# Patient Record
Sex: Male | Born: 1955 | Race: White | Hispanic: No | Marital: Single | Smoking: Current every day smoker
Health system: Southern US, Community
[De-identification: ages and names within clinical notes are randomized; demographics above are authoritative.]

---

## 2016-01-05 ENCOUNTER — Encounter (HOSPITAL_BASED_OUTPATIENT_CLINIC_OR_DEPARTMENT_OTHER): Payer: Self-pay | Admitting: Emergency Medicine

## 2016-01-05 ENCOUNTER — Emergency Department (HOSPITAL_BASED_OUTPATIENT_CLINIC_OR_DEPARTMENT_OTHER)
Admission: EM | Admit: 2016-01-05 | Discharge: 2016-01-05 | Disposition: A | Discharge status: Home / Self Care | Payer: Self-pay | Attending: Emergency Medicine | Admitting: Emergency Medicine

## 2016-01-05 DIAGNOSIS — G5701 Lesion of sciatic nerve, right lower limb: Secondary | ICD-10-CM | POA: Insufficient documentation

## 2016-01-05 DIAGNOSIS — F17210 Nicotine dependence, cigarettes, uncomplicated: Secondary | ICD-10-CM | POA: Insufficient documentation

## 2016-01-05 MED ORDER — KETOROLAC TROMETHAMINE 60 MG/2ML IM SOLN
30.0000 mg | Freq: Once | INTRAMUSCULAR | Status: AC
  Administered 2016-01-05: 30 mg via INTRAMUSCULAR
  Filled 2016-01-05: qty 2

## 2016-01-05 MED ORDER — ACETAMINOPHEN 500 MG PO TABS
1000.0000 mg | ORAL_TABLET | Freq: Once | ORAL | Status: AC
  Administered 2016-01-05: 1000 mg via ORAL
  Filled 2016-01-05: qty 2

## 2016-01-05 MED ORDER — DIAZEPAM 5 MG PO TABS
5.0000 mg | ORAL_TABLET | Freq: Once | ORAL | Status: AC
  Administered 2016-01-05: 5 mg via ORAL
  Filled 2016-01-05: qty 1

## 2016-01-05 MED ORDER — OXYCODONE HCL 5 MG PO TABS
5.0000 mg | ORAL_TABLET | Freq: Once | ORAL | Status: AC
  Administered 2016-01-05: 5 mg via ORAL
  Filled 2016-01-05: qty 1

## 2016-01-05 NOTE — Discharge Instructions (Signed)
Try the stretches, repeat 3 times for about 30s.  Do this 2 x a day.

## 2016-01-05 NOTE — ED Notes (Signed)
MD at bedside. 

## 2016-01-05 NOTE — ED Provider Notes (Signed)
[name] Note   CSN: [id number] Arrival date & time: 01/05/16  1035     History   Chief Complaint Chief Complaint  Patient presents with  . Back Pain    HPI Adam Hinton is a 60 y.o. male.  60 yo M with a chief complaint of right-sided low back pain. Going on for the past 3 days. Patient has just been lying in bed unable to get up and move around. Denies loss of bowel or bladder denies loss of perirectal sensation. Denies any weakness to the right lower extremity. Denies fevers denies abdominal pain. Patient complaining of pain mostly to the right SI joint though has some pain also at the greater trochanter. Pain is worse with certain positions and movement palpation. He feels that when he lays on his left side the stretching of the gluteus hurts his back. Denies recent trauma. Had a trauma back in May that had a negative CT abd/ pelvis.   The history is provided by the patient and a relative.  Back Pain   This is a recurrent problem. The current episode started more than 2 days ago. The problem occurs constantly. The problem has been gradually worsening. The pain is associated with twisting (playing golf). The pain is present in the lumbar spine. The quality of the pain is described as stabbing and shooting. The pain radiates to the right thigh. The pain is at a severity of 3/10. The pain is moderate. The symptoms are aggravated by bending, certain positions and twisting. Pertinent negatives include no chest pain, no fever, no headaches and no abdominal pain. He has tried analgesics and muscle relaxants for the symptoms. The treatment provided moderate relief.    History reviewed. No pertinent past medical history.  There are no active problems to display for this patient.   History reviewed. No pertinent surgical history.     Home Medications    Prior to Admission medications   Not on File    Family History History reviewed. No pertinent family  history.  Social History Social History  Substance Use Topics  . Smoking status: Current Every Day Smoker    Packs/day: 1.00    Types: Cigarettes  . Smokeless tobacco: Never Used  . Alcohol use Yes     Allergies   Review of patient's allergies indicates no known allergies.   Review of Systems Review of Systems  Constitutional: Negative for chills and fever.  HENT: Negative for congestion and facial swelling.   Eyes: Negative for discharge and visual disturbance.  Respiratory: Negative for shortness of breath.   Cardiovascular: Negative for chest pain and palpitations.  Gastrointestinal: Negative for abdominal pain, diarrhea and vomiting.  Musculoskeletal: Positive for arthralgias, back pain and myalgias.  Skin: Negative for color change and rash.  Neurological: Negative for tremors, syncope and headaches.  Psychiatric/Behavioral: Negative for confusion and dysphoric mood.     Physical Exam Updated Vital Signs BP 152/89 (BP Location: Left Arm)   Pulse 72   Temp 98.2 F (36.8 C) (Oral)   Resp 18   Ht 5\' 8"  (1.727 m)   Wt 138 lb (62.6 kg)   SpO2 100%   BMI 20.98 kg/m   Physical Exam  Constitutional: He is oriented to person, place, and time. He appears well-developed and well-nourished.  HENT:  Head: Normocephalic and atraumatic.  Eyes: Conjunctivae and EOM are normal. Pupils are equal, round, and reactive to light.  Neck: Normal range of motion. No JVD present.  Cardiovascular:  Normal rate and regular rhythm.   Pulmonary/Chest: Effort normal. No stridor. No respiratory distress.  Abdominal: He exhibits no distension. There is no tenderness. There is no guarding.  Musculoskeletal: Normal range of motion. He exhibits no edema.       Back:  Neurological: He is alert and oriented to person, place, and time.  Skin: Skin is warm and dry.  Psychiatric: He has a normal mood and affect. His behavior is normal.     ED Treatments / Results  Labs (all labs ordered  are listed, but only abnormal results are displayed) Labs Reviewed - No data to display  EKG  EKG Interpretation None       Radiology No results found.  Procedures Procedures (including critical care time)  Medications Ordered in ED Medications  diazepam (VALIUM) tablet 5 mg (5 mg Oral Given 01/05/16 1114)  oxyCODONE (Oxy IR/ROXICODONE) immediate release tablet 5 mg (5 mg Oral Given 01/05/16 1113)  acetaminophen (TYLENOL) tablet 1,000 mg (1,000 mg Oral Given 01/05/16 1113)  ketorolac (TORADOL) injection 30 mg (30 mg Intramuscular Given 01/05/16 1113)     Initial Impression / Assessment and Plan / ED Course  I have reviewed the triage vital signs and the nursing notes.  Pertinent labs & imaging results that were available during my care of the patient were reviewed by me and considered in my medical decision making (see chart for details).  Clinical Course    60 yo M With a chief complaint of right-sided low back pain. I suspect this may be piriformis syndrome that he has no tenderness about the piriformis muscle. Will treat with NSAIDs.  11:17 AM:  I have discussed the diagnosis/risks/treatment options with the patient and family and believe the pt to be eligible for discharge home to follow-up with PCP. We also discussed returning to the ED immediately if new or worsening sx occur. We discussed the sx which are most concerning (e.g., sudden worsening pain, fever, inability to tolerate by mouth) that necessitate immediate return. Medications administered to the patient during their visit and any new prescriptions provided to the patient are listed below.  Medications given during this visit Medications  diazepam (VALIUM) tablet 5 mg (5 mg Oral Given 01/05/16 1114)  oxyCODONE (Oxy IR/ROXICODONE) immediate release tablet 5 mg (5 mg Oral Given 01/05/16 1113)  acetaminophen (TYLENOL) tablet 1,000 mg (1,000 mg Oral Given 01/05/16 1113)  ketorolac (TORADOL) injection 30 mg (30 mg Intramuscular  Given 01/05/16 1113)     The patient appears reasonably screen and/or stabilized for discharge and I doubt any other medical condition or other [facility] requiring further screening, evaluation, or treatment in the ED at this time prior to discharge.    Final Clinical Impressions(s) / ED Diagnoses   Final diagnoses:  Piriformis syndrome of right side    New Prescriptions New Prescriptions   No medications on file     Deno Etienne, DO 01/05/16 1117

## 2016-01-05 NOTE — ED Triage Notes (Signed)
Patient reports right lower back and hip pain since yesterday morning.  Reports he took 10mg  of a muscle relaxer yesterday (thinks this was flexeril) and this helped but states his walk is not "right".  No incontinence episodes, no dysuria.

## 2018-02-21 ENCOUNTER — Other Ambulatory Visit: Payer: Self-pay

## 2018-02-21 ENCOUNTER — Encounter (HOSPITAL_COMMUNITY): Payer: Self-pay | Admitting: Emergency Medicine

## 2018-02-21 ENCOUNTER — Inpatient Hospital Stay (HOSPITAL_COMMUNITY)
Admission: EM | Admit: 2018-02-21 | Discharge: 2018-02-25 | DRG: 728 | Disposition: A | Discharge status: Home / Self Care | Payer: BLUE CROSS/BLUE SHIELD | Attending: Family Medicine | Admitting: Family Medicine

## 2018-02-21 ENCOUNTER — Emergency Department (HOSPITAL_COMMUNITY): Payer: BLUE CROSS/BLUE SHIELD

## 2018-02-21 DIAGNOSIS — K635 Polyp of colon: Secondary | ICD-10-CM | POA: Diagnosis present

## 2018-02-21 DIAGNOSIS — K5730 Diverticulosis of large intestine without perforation or abscess without bleeding: Secondary | ICD-10-CM | POA: Diagnosis present

## 2018-02-21 DIAGNOSIS — K5900 Constipation, unspecified: Secondary | ICD-10-CM | POA: Diagnosis not present

## 2018-02-21 DIAGNOSIS — E876 Hypokalemia: Secondary | ICD-10-CM | POA: Diagnosis not present

## 2018-02-21 DIAGNOSIS — K621 Rectal polyp: Secondary | ICD-10-CM | POA: Diagnosis present

## 2018-02-21 DIAGNOSIS — R102 Pelvic and perineal pain: Secondary | ICD-10-CM

## 2018-02-21 DIAGNOSIS — N419 Inflammatory disease of prostate, unspecified: Secondary | ICD-10-CM | POA: Diagnosis not present

## 2018-02-21 DIAGNOSIS — N390 Urinary tract infection, site not specified: Secondary | ICD-10-CM | POA: Diagnosis present

## 2018-02-21 DIAGNOSIS — R3911 Hesitancy of micturition: Secondary | ICD-10-CM | POA: Diagnosis present

## 2018-02-21 DIAGNOSIS — R933 Abnormal findings on diagnostic imaging of other parts of digestive tract: Secondary | ICD-10-CM

## 2018-02-21 DIAGNOSIS — D121 Benign neoplasm of appendix: Secondary | ICD-10-CM

## 2018-02-21 DIAGNOSIS — N138 Other obstructive and reflux uropathy: Secondary | ICD-10-CM | POA: Diagnosis present

## 2018-02-21 DIAGNOSIS — Z720 Tobacco use: Secondary | ICD-10-CM | POA: Diagnosis present

## 2018-02-21 DIAGNOSIS — K625 Hemorrhage of anus and rectum: Secondary | ICD-10-CM | POA: Diagnosis present

## 2018-02-21 DIAGNOSIS — N139 Obstructive and reflux uropathy, unspecified: Secondary | ICD-10-CM

## 2018-02-21 DIAGNOSIS — K644 Residual hemorrhoidal skin tags: Secondary | ICD-10-CM | POA: Diagnosis present

## 2018-02-21 DIAGNOSIS — R030 Elevated blood-pressure reading, without diagnosis of hypertension: Secondary | ICD-10-CM | POA: Diagnosis present

## 2018-02-21 DIAGNOSIS — N320 Bladder-neck obstruction: Secondary | ICD-10-CM | POA: Diagnosis not present

## 2018-02-21 DIAGNOSIS — F17210 Nicotine dependence, cigarettes, uncomplicated: Secondary | ICD-10-CM | POA: Diagnosis present

## 2018-02-21 DIAGNOSIS — N401 Enlarged prostate with lower urinary tract symptoms: Secondary | ICD-10-CM | POA: Diagnosis present

## 2018-02-21 DIAGNOSIS — K388 Other specified diseases of appendix: Secondary | ICD-10-CM

## 2018-02-21 DIAGNOSIS — K642 Third degree hemorrhoids: Secondary | ICD-10-CM | POA: Diagnosis present

## 2018-02-21 DIAGNOSIS — R32 Unspecified urinary incontinence: Secondary | ICD-10-CM | POA: Diagnosis present

## 2018-02-21 DIAGNOSIS — R1024 Suprapubic pain: Secondary | ICD-10-CM

## 2018-02-21 LAB — CBC
HCT: 47.6 % (ref 39.0–52.0)
Hemoglobin: 15.8 g/dL (ref 13.0–17.0)
MCH: 30.6 pg (ref 26.0–34.0)
MCHC: 33.2 g/dL (ref 30.0–36.0)
MCV: 92.1 fL (ref 80.0–100.0)
NRBC: 0 % (ref 0.0–0.2)
PLATELETS: 254 10*3/uL (ref 150–400)
RBC: 5.17 MIL/uL (ref 4.22–5.81)
RDW: 13.3 % (ref 11.5–15.5)
WBC: 19.8 10*3/uL — ABNORMAL HIGH (ref 4.0–10.5)

## 2018-02-21 LAB — COMPREHENSIVE METABOLIC PANEL
ALBUMIN: 4.2 g/dL (ref 3.5–5.0)
ALK PHOS: 63 U/L (ref 38–126)
ALT: 16 U/L (ref 0–44)
ANION GAP: 9 (ref 5–15)
AST: 43 U/L — ABNORMAL HIGH (ref 15–41)
BUN: 12 mg/dL (ref 8–23)
CALCIUM: 9.5 mg/dL (ref 8.9–10.3)
CO2: 28 mmol/L (ref 22–32)
Chloride: 100 mmol/L (ref 98–111)
Creatinine, Ser: 1.15 mg/dL (ref 0.61–1.24)
GFR calc Af Amer: >60 mL/min (ref >60)
GFR calc non Af Amer: >60 mL/min (ref >60)
GLUCOSE: 120 mg/dL — AB (ref 70–99)
POTASSIUM: 3.1 mmol/L — AB (ref 3.5–5.1)
SODIUM: 137 mmol/L (ref 135–145)
TOTAL PROTEIN: 7.5 g/dL (ref 6.5–8.1)
Total Bilirubin: 1.2 mg/dL (ref 0.3–1.2)

## 2018-02-21 LAB — URINALYSIS, ROUTINE W REFLEX MICROSCOPIC
Bacteria, UA: NONE SEEN
Bilirubin Urine: NEGATIVE
GLUCOSE, UA: 50 mg/dL — AB
Ketones, ur: NEGATIVE mg/dL
LEUKOCYTES UA: NEGATIVE
Nitrite: NEGATIVE
PROTEIN: NEGATIVE mg/dL
SPECIFIC GRAVITY, URINE: 1.012 (ref 1.005–1.030)
pH: 6 (ref 5.0–8.0)

## 2018-02-21 LAB — LIPASE, BLOOD: LIPASE: 28 U/L (ref 11–51)

## 2018-02-21 MED ORDER — SODIUM CHLORIDE 0.9 % IV BOLUS
1000.0000 mL | Freq: Once | INTRAVENOUS | Status: AC
  Administered 2018-02-21: 1000 mL via INTRAVENOUS

## 2018-02-21 MED ORDER — ACETAMINOPHEN 650 MG RE SUPP: 650.0000 mg | Freq: Four times a day (QID) | RECTAL | Status: DC | PRN

## 2018-02-21 MED ORDER — LIDOCAINE HCL URETHRAL/MUCOSAL 2 % EX GEL
1.0000 "application " | Freq: Once | CUTANEOUS | Status: DC
  Filled 2018-02-21: qty 5

## 2018-02-21 MED ORDER — ACETAMINOPHEN 325 MG PO TABS: 650.0000 mg | ORAL_TABLET | Freq: Four times a day (QID) | ORAL | Status: DC | PRN

## 2018-02-21 MED ORDER — LORAZEPAM 0.5 MG PO TABS: 0.5000 mg | ORAL_TABLET | Freq: Once | ORAL | Status: DC

## 2018-02-21 MED ORDER — CIPROFLOXACIN IN D5W 400 MG/200ML IV SOLN
400.0000 mg | Freq: Once | INTRAVENOUS | Status: AC
  Administered 2018-02-21: 400 mg via INTRAVENOUS
  Filled 2018-02-21: qty 200

## 2018-02-21 MED ORDER — ONDANSETRON HCL 4 MG/2ML IJ SOLN: 4.0000 mg | Freq: Four times a day (QID) | INTRAMUSCULAR | Status: DC | PRN

## 2018-02-21 MED ORDER — LORAZEPAM 2 MG/ML IJ SOLN
1.0000 mg | Freq: Once | INTRAMUSCULAR | Status: AC
  Administered 2018-02-22: 1 mg via INTRAVENOUS
  Filled 2018-02-21: qty 1

## 2018-02-21 MED ORDER — BISACODYL 10 MG RE SUPP: 10.0000 mg | Freq: Every day | RECTAL | Status: DC | PRN

## 2018-02-21 MED ORDER — ONDANSETRON HCL 4 MG PO TABS: 4.0000 mg | ORAL_TABLET | Freq: Four times a day (QID) | ORAL | Status: DC | PRN

## 2018-02-21 MED ORDER — POLYETHYLENE GLYCOL 3350 17 G PO PACK
17.0000 g | PACK | Freq: Every day | ORAL | Status: DC
  Administered 2018-02-21 – 2018-02-25 (×4): 17 g via ORAL
  Filled 2018-02-21 (×4): qty 1

## 2018-02-21 MED ORDER — METRONIDAZOLE IN NACL 5-0.79 MG/ML-% IV SOLN
500.0000 mg | Freq: Three times a day (TID) | INTRAVENOUS | Status: DC
  Administered 2018-02-21 – 2018-02-25 (×10): 500 mg via INTRAVENOUS
  Filled 2018-02-21 (×16): qty 100

## 2018-02-21 MED ORDER — METRONIDAZOLE IN NACL 5-0.79 MG/ML-% IV SOLN
500.0000 mg | Freq: Once | INTRAVENOUS | Status: AC
  Administered 2018-02-21: 500 mg via INTRAVENOUS
  Filled 2018-02-21: qty 100

## 2018-02-21 MED ORDER — SENNOSIDES-DOCUSATE SODIUM 8.6-50 MG PO TABS
1.0000 | ORAL_TABLET | Freq: Every day | ORAL | Status: DC
  Administered 2018-02-22 – 2018-02-25 (×3): 1 via ORAL
  Filled 2018-02-21 (×3): qty 1

## 2018-02-21 MED ORDER — CIPROFLOXACIN IN D5W 400 MG/200ML IV SOLN
400.0000 mg | Freq: Two times a day (BID) | INTRAVENOUS | Status: DC
  Administered 2018-02-22 – 2018-02-25 (×7): 400 mg via INTRAVENOUS
  Filled 2018-02-21 (×9): qty 200

## 2018-02-21 MED ORDER — IOHEXOL 300 MG/ML  SOLN
100.0000 mL | Freq: Once | INTRAMUSCULAR | Status: AC | PRN
  Administered 2018-02-21: 100 mL via INTRAVENOUS

## 2018-02-21 MED ORDER — NICOTINE 14 MG/24HR TD PT24
14.0000 mg | MEDICATED_PATCH | Freq: Every day | TRANSDERMAL | Status: DC
  Administered 2018-02-21 – 2018-02-25 (×4): 14 mg via TRANSDERMAL
  Filled 2018-02-21 (×4): qty 1

## 2018-02-21 MED ORDER — LIDOCAINE HCL URETHRAL/MUCOSAL 2 % EX GEL
1.0000 "application " | CUTANEOUS | Status: AC
  Administered 2018-02-22: 1 via URETHRAL
  Filled 2018-02-21: qty 20

## 2018-02-21 MED ORDER — POTASSIUM CHLORIDE CRYS ER 20 MEQ PO TBCR
40.0000 meq | EXTENDED_RELEASE_TABLET | Freq: Once | ORAL | Status: AC
  Administered 2018-02-21: 40 meq via ORAL
  Filled 2018-02-21: qty 2

## 2018-02-21 NOTE — ED Notes (Signed)
Gave Pt urine cup in hazard bag and he states he will attempt to urinate but had just went

## 2018-02-21 NOTE — ED Provider Notes (Signed)
[facility] EMERGENCY DEPARTMENT Provider Note   CSN: [id number] Arrival date & time: 02/21/18  1015     History   Chief Complaint Chief Complaint  Patient presents with  . Abdominal Pain  . Constipation    HPI Adam Hinton is a 62 y.o. male.  HPI  Patient is a 62 year old male with no significant past medical history presenting for suprapubic pain, incontinence of urine, and constipation.  Patient reports that for the past 2 days, he has felt himself triple incontinence of urine on himself.  He reports that throughout this time he has had a constant suprapubic pain and cannot empty his bladder.  He also reports he has not had a bowel movement in 2 days, which is abnormal for him.  Patient reports he tried multiple suppositories, which produced some minor amount of watery stool.  Patient reports that his rectum has intermittently bled and is "inflamed".  Patient denies back pain, fever, history of IVDU, history of cancer, weakness or numbness in lower extremities, or saddle anesthesia or loss of bowel control.  Patient reports he has been sexually active with the same male partner for the past 15 years, last unprotected sexual intercourse approximately 1 month ago.  Denies scrotal swelling, testicular pain, penile discharge.  Patient denies concerns about STI.  Patient reports he takes no medications.    History reviewed. No pertinent past medical history.  There are no active problems to display for this patient.   History reviewed. No pertinent surgical history.      Home Medications    Prior to Admission medications   Not on File    Family History No family history on file.  Social History Social History   Tobacco Use  . Smoking status: Current Every Day Smoker    Packs/day: 1.00    Types: Cigarettes  . Smokeless tobacco: Never Used  Substance Use Topics  . Alcohol use: Yes    Comment: daily   . Drug use: No     Allergies   Patient  has no known allergies.   Review of Systems Review of Systems  Constitutional: Negative for chills and fever.  HENT: Negative for congestion and sore throat.   Eyes: Negative for visual disturbance.  Respiratory: Negative for cough, chest tightness and shortness of breath.   Cardiovascular: Negative for chest pain, palpitations and leg swelling.  Gastrointestinal: Positive for abdominal pain. Negative for diarrhea and nausea.  Genitourinary: Negative for discharge, dysuria, flank pain, scrotal swelling and testicular pain.  Musculoskeletal: Negative for back pain and myalgias.  Skin: Negative for rash.  Neurological: Negative for dizziness, syncope, light-headedness and headaches.     Physical Exam Updated Vital Signs BP (!) 182/91   Pulse 76   Temp 98.1 F (36.7 C)   Resp 20   Ht 5\' 9"  (1.753 m)   Wt 68 kg   SpO2 98%   BMI 22.15 kg/m   Physical Exam  Constitutional: He appears well-developed and well-nourished. No distress.  HENT:  Head: Normocephalic and atraumatic.  Mouth/Throat: Oropharynx is clear and moist.  Eyes: Pupils are equal, round, and reactive to light. Conjunctivae and EOM are normal.  Neck: Normal range of motion. Neck supple.  Cardiovascular: Normal rate, regular rhythm, S1 normal and S2 normal.  No murmur heard. Pulmonary/Chest: Effort normal and breath sounds normal. He has no wheezes. He has no rales.  Abdominal: Soft. He exhibits no distension. There is tenderness in the suprapubic area. There is no guarding.  Genitourinary:  Genitourinary Comments: GU examination performed with EMT chaperone present.  Patient has no tenderness to palpation of bilateral testes.  No masses.  No swelling.  No erythema. Rectal exam, patient has multiple prolapsed internal hemorrhoids and surrounding erythema and inflammation of the rectum.  There is no induration.  No fluctuance.  Digital rectal exam exhibits circumferential thickening of the rectum, and normal rectal  tone.  No masses.  No stool impaction.  Musculoskeletal: Normal range of motion. He exhibits no edema or deformity.  Lymphadenopathy:    He has no cervical adenopathy.  Neurological: He is alert.  Cranial nerves grossly intact. Patient moves extremities symmetrically and with good coordination.  Skin: Skin is warm and dry. No rash noted. No erythema.  Psychiatric: He has a normal mood and affect. His behavior is normal. Judgment and thought content normal.  Nursing note and vitals reviewed.    ED Treatments / Results  Labs (all labs ordered are listed, but only abnormal results are displayed) Labs Reviewed  COMPREHENSIVE METABOLIC PANEL - Abnormal; Notable for the following components:      Result Value   Potassium 3.1 (*)    Glucose, Bld 120 (*)    AST 43 (*)    All other components within normal limits  CBC - Abnormal; Notable for the following components:   WBC 19.8 (*)    All other components within normal limits  URINALYSIS, ROUTINE W REFLEX MICROSCOPIC - Abnormal; Notable for the following components:   Glucose, UA 50 (*)    Hgb urine dipstick MODERATE (*)    All other components within normal limits  URINE CULTURE  LIPASE, BLOOD  RPR  HIV ANTIBODY (ROUTINE TESTING W REFLEX)  GC/CHLAMYDIA PROBE AMP ([place]) NOT AT [facility]    EKG None  Radiology Ct Abdomen Pelvis W Contrast  Result Date: 02/21/2018 CLINICAL DATA:  Constipation and urinary retention for last 2 days. EXAM: CT ABDOMEN AND PELVIS WITH CONTRAST TECHNIQUE: Multidetector CT imaging of the abdomen and pelvis was performed using the standard protocol following bolus administration of intravenous contrast. CONTRAST:  119mL OMNIPAQUE IOHEXOL 300 MG/ML  SOLN COMPARISON:  None. FINDINGS: Lower chest: Mild ground-glass attenuation of the right middle lobe and lingula, likely infectious/inflammatory. Hepatobiliary: No focal liver abnormality is seen. No gallstones, gallbladder wall thickening, or biliary  dilatation. Pancreas: Unremarkable. No pancreatic ductal dilatation or surrounding inflammatory changes. Spleen: Normal in size without focal abnormality. Adrenals/Urinary Tract: Normal adrenal glands. Bilateral perinephric stranding, more prominent on the right. Bilateral benign-appearing renal cysts, the larger of of the superior pole of the left kidney measures 3 cm. No nephrolithiasis or hydronephrosis. Mild mucosal thickening and hyperenhancement of the urinary bladder, and possibly the ureters. Stomach/Bowel: Normal appearance of the stomach and small bowel. Scattered colonic diverticulosis without evidence of acute diverticulitis. Low-density circumscribed structure in the right pelvis measures 2.4 x 3.9 x 3.0 cm. It comes in contact with the cecum, inferior to the ileocecal valve, at the expected location of the appendix. Vascular/Lymphatic: Aortic atherosclerosis, mild. No enlarged abdominal or pelvic lymph nodes. Reproductive: Enlargement and heterogeneous enhancement of the prostate gland, which measures 5.9 cm Other: No abdominal wall hernia or abnormality. No abdominopelvic ascites. Musculoskeletal: No acute or significant osseous findings. IMPRESSION: Bilateral perinephric stranding, hyperenhancement and mucosal thickening of the urinary bladder and the bilateral ureters. Enlargement and heterogeneous enhancement of the prostate gland. These findings may reflect complicated urinary tract infection/prostatitis, or bladder outlet obstruction caused by the enlarged prostate gland. Please correlate  to PSA values and urinalysis results. Low-density circumscribed structure adjacent to the distal cecum may represent a cystically dilated appendix with thickened walls. This may be secondary to an acute appendicitis or an appendiceal mucocele. Surgical consultation is recommended. Electronically Signed   By: Fidela Salisbury M.D.   On: 02/21/2018 15:59    Procedures Procedures (including critical care  time)  Medications Ordered in ED Medications  lidocaine (XYLOCAINE) 2 % jelly 1 application (1 application Urethral Not Given 02/21/18 1509)  LORazepam (ATIVAN) tablet 0.5 mg (has no administration in time range)  potassium chloride SA (K-DUR,KLOR-CON) CR tablet 40 mEq (has no administration in time range)  iohexol (OMNIPAQUE) 300 MG/ML solution 100 mL (100 mLs Intravenous Contrast Given 02/21/18 1508)     Initial Impression / Assessment and Plan / ED Course  I have reviewed the triage vital signs and the nursing notes.  Pertinent labs & imaging results that were available during my care of the patient were reviewed by me and considered in my medical decision making (see chart for details).  Clinical Course as of Feb 22 1735  Sat Feb 21, 2018  1534 GFR, Est Non African American: >60 [AM]  1616 Suggestive of acute infection.  WBC(!): 19.8 [AM]  71 Spoke with Dr. Barry Dienes who will come to evaluate the patient.  I appreciate her involvement in the care of this patient.   [AM]  1640 Spoke with Dr. grounds of [facility] hospitalist who will admit the patient.  I appreciate his involvement in the care of this patient.   [AM]    Clinical Course User Index [AM] Albesa Seen, PA-C    Patient nontoxic-appearing, afebrile, and with benign abdomen.  Differential diagnosis includes bladder outlet obstruction, origin in prostate either infectious versus inflammatory versus neoplastic versus other obstructive process in the pelvis.  Patient does not have any back pain or neurologic symptoms in his lower extremities suggestive of cauda equina as the cause of his overflow incontinence.  Patient has profound leukocytosis.  Possibly suggestive of infection.  Patient has moderate hemoglobinuria, but otherwise no obvious signs of infection in the urine.  Patient is hypokalemic at 3.1.  CT scan of the abdomen and pelvis is demonstrating perinephric stranding, low-attenuation mass in the pelvis with  inflammatory changes, coming in contact with ileocecal valve area where appendix is suspected.  Radiologist recommends clinical correlation for appendicitis.  Patient covered with both ciprofloxacin for prostatitis and Flagyl.  Patient to be admitted.   This is a shared visit with Dr. Lennice Sites. Patient was independently evaluated by this attending physician. Attending physician consulted in evaluation and admission management.  Final Clinical Impressions(s) / ED Diagnoses   Final diagnoses:  Suprapubic pain  Urinary obstruction  Elevated blood pressure reading without diagnosis of hypertension    ED Discharge Orders    None       Tamala Julian 02/21/18 1737    Lennice Sites, DO 02/21/18 1925

## 2018-02-21 NOTE — H&P (Signed)
History and Physical   Adam Hinton ZOX:[id number] DOB: 1956-04-06 DOA: 02/21/2018  Referring MD/NP/PA: Langston Masker, PA PCP: None Outpatient Specialists: None  Patient coming from: Home by way of [facility]  Chief Complaint: Urinary retention, pelvic pressure/pain, constipation  HPI: Adam Hinton is a 62 y.o. male with no medical follow up or known PMH who presented to the ED on the advise of urgent care MD due to urinary retention among other symptoms. He reports noticing gradual onset of pressure-type discomfort in the suprapubic area and superior to the base of the penis 2 nights ago which has remained constant, waxing/waning and is associated with severe urinary hesitancy and small volume urinary incontinence and constipation. Constipation was not improved with suppository and caused him to stop eating, though he kept drinking fluids.    ED Course: He appeared in no distress, hypertensive, metabolic panel with K 3.1, creatinine 1.15, and WBC 19k. He was unable to urinate so I/O catheterization was performed for urinalysis, yielding 1300cc urine which had no WBCs or bacteria, 6-10 RBCs. CT abdomen/pelvis performed showed a circumscribed low density structure in the expected location of the appendix that was 2.4x3.9x3.0cm, diverticulosis without diverticulitis, and R > L perinephric stranding without hydronephrosis, ureteral and bladder wall enhancement and enlarged (5.9cm) heterogeneously enhancing prostate. Mild GGOs in RML and lingula incidentally noted as well. Concern was for appendicitis vs. mucocele. Surgery consulted, hospitalists asked to admit. Cipro/flagyl started. GI and urology consulted for additional recommendations and indwelling foley catheterization ordered with bladder scan showing 999cc.   - ROS: No fevers, chills, abdominal pain. +nausea and vomiting. Some red blood per rectum but not significant. No other bleeding/bruising. Denies chest pain, dyspnea, cough, and per HPI.  All others reviewed and are negative.   - SH: Smokes < 1/2ppd cigarettes and drinks irregularly. No illicit drugs. Has a single male partner but does not live with her.   - FH: No history of prostate cancer, colon cancer  - PMSH: None, has been told he may have HTN.   - Meds: None  - Allergies: None known  Physical Exam: Vitals:   02/21/18 1715 02/21/18 1730 02/21/18 1745 02/21/18 1800  BP:  (!) 158/90  (!) 148/77  Pulse: 74 70 69 99  Resp:      Temp:      SpO2: 97% 95% 96% 94%  Weight:      Height:       Constitutional: 62 y.o. male in no distress, restless demeanor Eyes: Lids and conjunctivae normal, PERRL ENMT: Mucous membranes are moist. Posterior pharynx clear of any exudate or lesions. Fair dentition.  Neck: normal, supple, no masses, no thyromegaly Respiratory: Non-labored breathing room air without accessory muscle use. Clear breath sounds to auscultation bilaterally Cardiovascular: Regular rate and rhythm, no murmurs, rubs, or gallops. No carotid bruits. No JVD. No LE edema. 2+ pedal pulses. Abdomen: Hypoactive bowel sounds. Minimal tenderness to deep palpation bilaterally. No RLQ point tenderness. +Suprapubic tenderness. Nondistended, no hernia. GU: Normal circumcised penis. No bleeding. No indwelling catheter Musculoskeletal: No clubbing / cyanosis. No joint deformity upper and lower extremities. Good ROM, no contractures. Normal muscle tone.  Skin: Warm, dry. No rashes, wounds, or ulcers. No significant lesions noted.  Neurologic: CN II-XII grossly intact. Gait normal. Speech normal. No focal deficits in motor strength or sensation in all extremities.  Psychiatric: Alert and oriented x3. Borderline flight of ideas but able to maintain linear thought process when redirected. No profuse speech, pressured speech, unredirectable tangentiality. Abnormal affect  with intermittent moaning when discussing plan of care possibly indicating mild histrionic tendencies.   Labs on  Admission: I have personally reviewed following labs and imaging studies  CBC: Recent Labs  Lab 02/21/18 1048  WBC 19.8*  HGB 15.8  HCT 47.6  MCV 92.1  PLT 016   Basic Metabolic Panel: Recent Labs  Lab 02/21/18 1048  NA 137  K 3.1*  CL 100  CO2 28  GLUCOSE 120*  BUN 12  CREATININE 1.15  CALCIUM 9.5   Liver Function Tests: Recent Labs  Lab 02/21/18 1048  AST 43*  ALT 16  ALKPHOS 63  BILITOT 1.2  PROT 7.5  ALBUMIN 4.2   Recent Labs  Lab 02/21/18 1048  LIPASE 28   Urine analysis:    Component Value Date/Time   COLORURINE YELLOW 02/21/2018 [place] 02/21/2018 1145   LABSPEC 1.012 02/21/2018 1145   PHURINE 6.0 02/21/2018 1145   GLUCOSEU 50 (A) 02/21/2018 1145   HGBUR MODERATE (A) 02/21/2018 1145   BILIRUBINUR NEGATIVE 02/21/2018 1145   KETONESUR NEGATIVE 02/21/2018 1145   PROTEINUR NEGATIVE 02/21/2018 1145   NITRITE NEGATIVE 02/21/2018 1145   LEUKOCYTESUR NEGATIVE 02/21/2018 1145   Radiological Exams on Admission: Ct Abdomen Pelvis W Contrast  Result Date: 02/21/2018 CLINICAL DATA:  Constipation and urinary retention for last 2 days. EXAM: CT ABDOMEN AND PELVIS WITH CONTRAST TECHNIQUE: Multidetector CT imaging of the abdomen and pelvis was performed using the standard protocol following bolus administration of intravenous contrast. CONTRAST:  182mL OMNIPAQUE IOHEXOL 300 MG/ML  SOLN COMPARISON:  None. FINDINGS: Lower chest: Mild ground-glass attenuation of the right middle lobe and lingula, likely infectious/inflammatory. Hepatobiliary: No focal liver abnormality is seen. No gallstones, gallbladder wall thickening, or biliary dilatation. Pancreas: Unremarkable. No pancreatic ductal dilatation or surrounding inflammatory changes. Spleen: Normal in size without focal abnormality. Adrenals/Urinary Tract: Normal adrenal glands. Bilateral perinephric stranding, more prominent on the right. Bilateral benign-appearing renal cysts, the larger of of the  superior pole of the left kidney measures 3 cm. No nephrolithiasis or hydronephrosis. Mild mucosal thickening and hyperenhancement of the urinary bladder, and possibly the ureters. Stomach/Bowel: Normal appearance of the stomach and small bowel. Scattered colonic diverticulosis without evidence of acute diverticulitis. Low-density circumscribed structure in the right pelvis measures 2.4 x 3.9 x 3.0 cm. It comes in contact with the cecum, inferior to the ileocecal valve, at the expected location of the appendix. Vascular/Lymphatic: Aortic atherosclerosis, mild. No enlarged abdominal or pelvic lymph nodes. Reproductive: Enlargement and heterogeneous enhancement of the prostate gland, which measures 5.9 cm Other: No abdominal wall hernia or abnormality. No abdominopelvic ascites. Musculoskeletal: No acute or significant osseous findings. IMPRESSION: Bilateral perinephric stranding, hyperenhancement and mucosal thickening of the urinary bladder and the bilateral ureters. Enlargement and heterogeneous enhancement of the prostate gland. These findings may reflect complicated urinary tract infection/prostatitis, or bladder outlet obstruction caused by the enlarged prostate gland. Please correlate to PSA values and urinalysis results. Low-density circumscribed structure adjacent to the distal cecum may represent a cystically dilated appendix with thickened walls. This may be secondary to an acute appendicitis or an appendiceal mucocele. Surgical consultation is recommended. Electronically Signed   By: Fidela Salisbury M.D.   On: 02/21/2018 15:59   Assessment/Plan Active Problems:   Prostatitis   Bladder outlet obstruction   Mucocele of appendix   Constipation   Tobacco use   Hypokalemia   Bladder outlet obstruction, prostatitis, UTI: Due to prostate enlargement. Acuity of onset, leukocytosis argues for prostatic  infection, though atypical symptoms, afebrile, and clean UA. Perinephric stranding and enhancement  of ureters/bladder thought to be either reactive to BOO or right pelvic inflammation based on bland UA. No nephrolithiasis on imaging, so likely RBCs on UA related to catheterization. - Patient needs foley catheterization. This was discussed at length, including the probability that it will be required for days-weeks pending urology input.  - Urology consulted, appreciate recommendations. - Agree with cipro/flagyl for now. - Trend WBC  Right pelvic inflammation: Mucocele felt to be more likely than appendicitis. Diverticulosis noted without evidence of diverticulitis. - Surgery consulted. Recommendations pending. Doubt surgery tonight, so will allow liquids, NPO p MN.  - GI consulted. Pt has never had colonoscopy.   Constipation: Probably due to poor po, vomiting, and colonic inflammation. No stool ball on CT.  - Senna, miralax, prn dulcolax  Tobacco use:  - cessation counseling provided - Nicotine patch  Hypokalemia: Replaced, will recheck.   Mild AST elevation: Unlikely to be clinically significant in isolation, possibly reactive to local inflammation. Normal-appearing liver on CT.  - No exhaustive work up planned.   DVT prophylaxis: SCDs due to report of red blood per rectum and possible procedure  Code Status: Full  Family Communication: Significant other at bedside Disposition Plan: Anticipate discharge home following recommended work up by consultants.  Consults called: General surgery, [facility], Urology  Admission status: Observation.    Patrecia Pour, MD [facility] [email or web address] Password [facility] 02/21/2018, 6:32 PM

## 2018-02-21 NOTE — ED Notes (Signed)
Pt states that he was able to have "a little bit of a watery stool, but not much, not a real one"

## 2018-02-21 NOTE — ED Notes (Signed)
Pt not in triage chairs of lobby when name called; found that patient in lobby bathroom.

## 2018-02-21 NOTE — ED Triage Notes (Signed)
Pt presents from [facility] to r/o mass or tumor causing severe constipation, urinary obstruction x2 days. States "for the first time since Friday, I was able to get a little bit of relief by runny stool and a little bit of pee". He has taken a stool softener yesterday.  Denies fever but had emesis yesterday. Pt alert and oriented NAD at triage.

## 2018-02-21 NOTE — Progress Notes (Signed)
Patient arrived to the unit A&Ox4 and ambulatory.  Patient and family oriented to room and equipment.  Will continue to monitor.
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
## 2018-02-22 ENCOUNTER — Other Ambulatory Visit: Payer: Self-pay

## 2018-02-22 DIAGNOSIS — N410 Acute prostatitis: Secondary | ICD-10-CM

## 2018-02-22 DIAGNOSIS — D121 Benign neoplasm of appendix: Secondary | ICD-10-CM | POA: Diagnosis present

## 2018-02-22 DIAGNOSIS — K625 Hemorrhage of anus and rectum: Secondary | ICD-10-CM | POA: Diagnosis present

## 2018-02-22 DIAGNOSIS — R933 Abnormal findings on diagnostic imaging of other parts of digestive tract: Secondary | ICD-10-CM

## 2018-02-22 DIAGNOSIS — D124 Benign neoplasm of descending colon: Secondary | ICD-10-CM | POA: Diagnosis not present

## 2018-02-22 DIAGNOSIS — N401 Enlarged prostate with lower urinary tract symptoms: Secondary | ICD-10-CM | POA: Diagnosis present

## 2018-02-22 DIAGNOSIS — N138 Other obstructive and reflux uropathy: Secondary | ICD-10-CM | POA: Diagnosis present

## 2018-02-22 DIAGNOSIS — R102 Pelvic and perineal pain: Secondary | ICD-10-CM

## 2018-02-22 DIAGNOSIS — R32 Unspecified urinary incontinence: Secondary | ICD-10-CM | POA: Diagnosis present

## 2018-02-22 DIAGNOSIS — K388 Other specified diseases of appendix: Secondary | ICD-10-CM | POA: Diagnosis not present

## 2018-02-22 DIAGNOSIS — R3911 Hesitancy of micturition: Secondary | ICD-10-CM | POA: Diagnosis present

## 2018-02-22 DIAGNOSIS — N390 Urinary tract infection, site not specified: Secondary | ICD-10-CM | POA: Diagnosis present

## 2018-02-22 DIAGNOSIS — N419 Inflammatory disease of prostate, unspecified: Secondary | ICD-10-CM | POA: Diagnosis present

## 2018-02-22 DIAGNOSIS — K621 Rectal polyp: Secondary | ICD-10-CM | POA: Diagnosis present

## 2018-02-22 DIAGNOSIS — E876 Hypokalemia: Secondary | ICD-10-CM | POA: Diagnosis present

## 2018-02-22 DIAGNOSIS — K5900 Constipation, unspecified: Secondary | ICD-10-CM | POA: Diagnosis present

## 2018-02-22 DIAGNOSIS — N320 Bladder-neck obstruction: Secondary | ICD-10-CM | POA: Diagnosis present

## 2018-02-22 DIAGNOSIS — F17210 Nicotine dependence, cigarettes, uncomplicated: Secondary | ICD-10-CM | POA: Diagnosis present

## 2018-02-22 DIAGNOSIS — K642 Third degree hemorrhoids: Secondary | ICD-10-CM | POA: Diagnosis present

## 2018-02-22 DIAGNOSIS — K644 Residual hemorrhoidal skin tags: Secondary | ICD-10-CM | POA: Diagnosis present

## 2018-02-22 DIAGNOSIS — N139 Obstructive and reflux uropathy, unspecified: Secondary | ICD-10-CM

## 2018-02-22 DIAGNOSIS — K635 Polyp of colon: Secondary | ICD-10-CM | POA: Diagnosis present

## 2018-02-22 DIAGNOSIS — D120 Benign neoplasm of cecum: Secondary | ICD-10-CM | POA: Diagnosis not present

## 2018-02-22 DIAGNOSIS — D128 Benign neoplasm of rectum: Secondary | ICD-10-CM | POA: Diagnosis not present

## 2018-02-22 DIAGNOSIS — D123 Benign neoplasm of transverse colon: Secondary | ICD-10-CM | POA: Diagnosis not present

## 2018-02-22 DIAGNOSIS — R030 Elevated blood-pressure reading, without diagnosis of hypertension: Secondary | ICD-10-CM | POA: Diagnosis present

## 2018-02-22 DIAGNOSIS — K5730 Diverticulosis of large intestine without perforation or abscess without bleeding: Secondary | ICD-10-CM | POA: Diagnosis present

## 2018-02-22 LAB — BASIC METABOLIC PANEL
Anion gap: 7 (ref 5–15)
BUN: 9 mg/dL (ref 8–23)
CO2: 26 mmol/L (ref 22–32)
CREATININE: 0.94 mg/dL (ref 0.61–1.24)
Calcium: 9.1 mg/dL (ref 8.9–10.3)
Chloride: 108 mmol/L (ref 98–111)
GFR calc non Af Amer: >60 mL/min (ref >60)
Glucose, Bld: 112 mg/dL — ABNORMAL HIGH (ref 70–99)
Potassium: 3.2 mmol/L — ABNORMAL LOW (ref 3.5–5.1)
SODIUM: 141 mmol/L (ref 135–145)

## 2018-02-22 LAB — URINE CULTURE: Culture: NO GROWTH

## 2018-02-22 LAB — CBC
HEMATOCRIT: 41.9 % (ref 39.0–52.0)
Hemoglobin: 14.1 g/dL (ref 13.0–17.0)
MCH: 30.5 pg (ref 26.0–34.0)
MCHC: 33.7 g/dL (ref 30.0–36.0)
MCV: 90.5 fL (ref 80.0–100.0)
NRBC: 0 % (ref 0.0–0.2)
PLATELETS: 224 10*3/uL (ref 150–400)
RBC: 4.63 MIL/uL (ref 4.22–5.81)
RDW: 13.2 % (ref 11.5–15.5)
WBC: 15.6 10*3/uL — AB (ref 4.0–10.5)

## 2018-02-22 LAB — RPR: RPR Ser Ql: NONREACTIVE

## 2018-02-22 MED ORDER — TAMSULOSIN HCL 0.4 MG PO CAPS
0.4000 mg | ORAL_CAPSULE | Freq: Every day | ORAL | Status: DC
  Administered 2018-02-22 – 2018-02-25 (×4): 0.4 mg via ORAL
  Filled 2018-02-22 (×4): qty 1

## 2018-02-22 MED ORDER — POTASSIUM CHLORIDE CRYS ER 20 MEQ PO TBCR
40.0000 meq | EXTENDED_RELEASE_TABLET | Freq: Once | ORAL | Status: AC
  Administered 2018-02-22: 40 meq via ORAL
  Filled 2018-02-22: qty 2

## 2018-02-22 NOTE — Progress Notes (Signed)
Subjective/Chief Complaint: No complaints. Feels better   Objective: Vital signs in last 24 hours: Temp:  [98.1 F (36.7 C)-99.5 F (37.5 C)] 99.5 F (37.5 C) (10/20 0416) Pulse Rate:  [52-99] 82 (10/20 0416) Resp:  [16-20] 16 (10/20 0416) BP: (139-183)/(77-105) 146/80 (10/20 0416) SpO2:  [94 %-100 %] 96 % (10/20 0416) Weight:  [68 kg] 68 kg (10/19 1042) Last BM Date: 02/21/18  Intake/Output from previous day: 10/19 0701 - 10/20 0700 In: 654.8 [IV Piggyback:654.8] Out: 3500 [Urine:3500] Intake/Output this shift: No intake/output data recorded.  General appearance: alert and cooperative Resp: clear to auscultation bilaterally Cardio: regular rate and rhythm GI: soft, mild lower abd tenderness  Lab Results:  Recent Labs    02/21/18 1048 02/22/18 0255  WBC 19.8* 15.6*  HGB 15.8 14.1  HCT 47.6 41.9  PLT 254 224   BMET Recent Labs    02/21/18 1048 02/22/18 0255  NA 137 141  K 3.1* 3.2*  CL 100 108  CO2 28 26  GLUCOSE 120* 112*  BUN 12 9  CREATININE 1.15 0.94  CALCIUM 9.5 9.1   PT/INR No results for input(s): LABPROT, INR in the last 72 hours. ABG No results for input(s): PHART, HCO3 in the last 72 hours.  Invalid input(s): PCO2, PO2  Studies/Results: Ct Abdomen Pelvis W Contrast  Result Date: 02/21/2018 CLINICAL DATA:  Constipation and urinary retention for last 2 days. EXAM: CT ABDOMEN AND PELVIS WITH CONTRAST TECHNIQUE: Multidetector CT imaging of the abdomen and pelvis was performed using the standard protocol following bolus administration of intravenous contrast. CONTRAST:  173mL OMNIPAQUE IOHEXOL 300 MG/ML  SOLN COMPARISON:  None. FINDINGS: Lower chest: Mild ground-glass attenuation of the right middle lobe and lingula, likely infectious/inflammatory. Hepatobiliary: No focal liver abnormality is seen. No gallstones, gallbladder wall thickening, or biliary dilatation. Pancreas: Unremarkable. No pancreatic ductal dilatation or surrounding  inflammatory changes. Spleen: Normal in size without focal abnormality. Adrenals/Urinary Tract: Normal adrenal glands. Bilateral perinephric stranding, more prominent on the right. Bilateral benign-appearing renal cysts, the larger of of the superior pole of the left kidney measures 3 cm. No nephrolithiasis or hydronephrosis. Mild mucosal thickening and hyperenhancement of the urinary bladder, and possibly the ureters. Stomach/Bowel: Normal appearance of the stomach and small bowel. Scattered colonic diverticulosis without evidence of acute diverticulitis. Low-density circumscribed structure in the right pelvis measures 2.4 x 3.9 x 3.0 cm. It comes in contact with the cecum, inferior to the ileocecal valve, at the expected location of the appendix. Vascular/Lymphatic: Aortic atherosclerosis, mild. No enlarged abdominal or pelvic lymph nodes. Reproductive: Enlargement and heterogeneous enhancement of the prostate gland, which measures 5.9 cm Other: No abdominal wall hernia or abnormality. No abdominopelvic ascites. Musculoskeletal: No acute or significant osseous findings. IMPRESSION: Bilateral perinephric stranding, hyperenhancement and mucosal thickening of the urinary bladder and the bilateral ureters. Enlargement and heterogeneous enhancement of the prostate gland. These findings may reflect complicated urinary tract infection/prostatitis, or bladder outlet obstruction caused by the enlarged prostate gland. Please correlate to PSA values and urinalysis results. Low-density circumscribed structure adjacent to the distal cecum may represent a cystically dilated appendix with thickened walls. This may be secondary to an acute appendicitis or an appendiceal mucocele. Surgical consultation is recommended. Electronically Signed   By: Fidela Salisbury M.D.   On: 02/21/2018 15:59    Anti-infectives: Anti-infectives (From admission, onward)   Start     Dose/Rate Route Frequency Ordered Stop   02/22/18 0500   ciprofloxacin (CIPRO) IVPB 400 mg  400 mg 200 mL/hr over 60 Minutes Intravenous Every 12 hours 02/21/18 1835     02/21/18 2200  metroNIDAZOLE (FLAGYL) IVPB 500 mg     500 mg 100 mL/hr over 60 Minutes Intravenous Every 8 hours 02/21/18 1835     02/21/18 1615  ciprofloxacin (CIPRO) IVPB 400 mg     400 mg 200 mL/hr over 60 Minutes Intravenous  Once 02/21/18 1613 02/21/18 1842   02/21/18 1615  metroNIDAZOLE (FLAGYL) IVPB 500 mg     500 mg 100 mL/hr over 60 Minutes Intravenous  Once 02/21/18 1613 02/21/18 1842      Assessment/Plan: s/p * No surgery found * Continue IV abx for prostatitis  Possible mucocele of appendix. May need surgery at some point for this but would like prostatitis cleared first Will follow  LOS: 0 days    Autumn Messing III 02/22/2018
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
## 2018-02-22 NOTE — Progress Notes (Signed)
PROGRESS NOTE  Dirk Vanaman OVF:[id number] DOB: 04/28/1956 DOA: 02/21/2018 PCP: Patient, No Pcp Per  HPI/Recap of past 24 hours: HPI from Dr Randel Pigg is a 62 y.o. male with no medical follow up or known PMH who presented to the ED on the advise of urgent care MD due to urinary retention and suprapubic pain. He reports noticing gradual onset of pressure-type suprapubic pain about 2 nights ago, which remained constant, waxing/waning and is associated with severe urinary hesitancy and small volume urinary incontinence and constipation, no dysuria. In the ED, pt was unable to urinate so I/O catheterization was performed for urinalysis, yielding 1300cc urine which had no WBCs or bacteria, 6-10 RBCs. A foley catheter was eventually inserted after bladder scan showed persistent retention. CT abdomen/pelvis performed showed a circumscribed low density structure in the expected location of the appendix that was 2.4x3.9x3.0cm, diverticulosis without diverticulitis, and R > L perinephric stranding without hydronephrosis, ureteral and bladder wall enhancement and enlarged (5.9cm) heterogeneously enhancing prostate. Concern was for appendicitis vs. mucocele. Surgery, GI and urology consulted. Cipro/flagyl started. Pt admitted for further management.   Today, pt reported suprapubic pain is resolving since after the foley insertion, still no BM. Pt denies any N/V, chest pain, SOB, fever/chills  Assessment/Plan: Active Problems:   Prostatitis   Bladder outlet obstruction   Mucocele of appendix   Constipation   Tobacco use   Hypokalemia  Bladder outlet obstruction, ?prostatitis Afebrile, resolving leukocytosis UA unremarkable, UC no growth CT abdomen/pelvis performed showed bilateral perinephric stranding without hydronephrosis, ureteral and bladder wall enhancement and enlarged (5.9cm) heterogeneously enhancing prostate Continue foley Continue cipro/flagyl for now Urology consulted,  appreciate recs  Appendiceal mucocele Vs acute appendicitis CT showed above General surgery consulted, wants GI to possibly perform a colonoscopy prior to any possible surgical intervention GI on board: plan for ?colonoscopy, appreciate recs Monitor closely  Constipation Continue senna, miralax, prn ducolax  Hypokalemia Replace prn  Tobacco abuse Advised to quit Nicotine patch       Code Status: Full  Family Communication: None at bedside  Disposition Plan: TBD   Consultants:  Urology  GI  Gen Surg  Procedures:  None  Antimicrobials:  Flagyl  Cipro  DVT prophylaxis:  SCDs pending ?colonoscopy Vs surgical intervention   Objective: Vitals:   02/21/18 1800 02/21/18 1839 02/21/18 2155 02/22/18 0416  BP: (!) 148/77 (!) 183/94 (!) 183/92 (!) 146/80  Pulse: 99 76 73 82  Resp:   16 16  Temp:  98.5 F (36.9 C) 98.3 F (36.8 C) 99.5 F (37.5 C)  TempSrc:  Oral Oral Oral  SpO2: 94% 100% 96% 96%  Weight:      Height:        Intake/Output Summary (Last 24 hours) at 02/22/2018 1321 Last data filed at 02/22/2018 1059 Gross per 24 hour  Intake 754.77 ml  Output 3500 ml  Net -2745.23 ml   Filed Weights   02/21/18 1042  Weight: 68 kg    Exam:   General: NAD, talkative   Cardiovascular: S1, S2 present   Respiratory: CTAB  Abdomen: Soft, NT, ND, BS present  Musculoskeletal: No pedal edema bilaterally   Skin: Normal  Psychiatry: Normal mood   Data Reviewed: CBC: Recent Labs  Lab 02/21/18 1048 02/22/18 0255  WBC 19.8* 15.6*  HGB 15.8 14.1  HCT 47.6 41.9  MCV 92.1 90.5  PLT 254 841   Basic Metabolic Panel: Recent Labs  Lab 02/21/18 1048 02/22/18 0255  NA 137 141  K 3.1* 3.2*  CL 100 108  CO2 28 26  GLUCOSE 120* 112*  BUN 12 9  CREATININE 1.15 0.94  CALCIUM 9.5 9.1   GFR: Estimated Creatinine Clearance: 77.4 mL/min (by C-G formula based on SCr of 0.94 mg/dL). Liver Function Tests: Recent Labs  Lab 02/21/18 1048    AST 43*  ALT 16  ALKPHOS 63  BILITOT 1.2  PROT 7.5  ALBUMIN 4.2   Recent Labs  Lab 02/21/18 1048  LIPASE 28   No results for input(s): AMMONIA in the last 168 hours. Coagulation Profile: No results for input(s): INR, PROTIME in the last 168 hours. Cardiac Enzymes: No results for input(s): CKTOTAL, CKMB, CKMBINDEX, TROPONINI in the last 168 hours. BNP (last 3 results) No results for input(s): PROBNP in the last 8760 hours. HbA1C: No results for input(s): HGBA1C in the last 72 hours. CBG: No results for input(s): GLUCAP in the last 168 hours. Lipid Profile: No results for input(s): CHOL, HDL, LDLCALC, TRIG, CHOLHDL, LDLDIRECT in the last 72 hours. Thyroid Function Tests: No results for input(s): TSH, T4TOTAL, FREET4, T3FREE, THYROIDAB in the last 72 hours. Anemia Panel: No results for input(s): VITAMINB12, FOLATE, FERRITIN, TIBC, IRON, RETICCTPCT in the last 72 hours. Urine analysis:    Component Value Date/Time   COLORURINE YELLOW 02/21/2018 1145   APPEARANCEUR CLEAR 02/21/2018 1145   LABSPEC 1.012 02/21/2018 1145   PHURINE 6.0 02/21/2018 1145   GLUCOSEU 50 (A) 02/21/2018 1145   HGBUR MODERATE (A) 02/21/2018 1145   BILIRUBINUR NEGATIVE 02/21/2018 1145   KETONESUR NEGATIVE 02/21/2018 1145   PROTEINUR NEGATIVE 02/21/2018 1145   NITRITE NEGATIVE 02/21/2018 1145   LEUKOCYTESUR NEGATIVE 02/21/2018 1145   Sepsis Labs: @LABRCNTIP (procalcitonin:4,lacticidven:4)  ) Recent Results (from the past 240 hour(s))  Urine culture     Status: None   Collection Time: 02/21/18  2:50 PM  Result Value Ref Range Status   Specimen Description URINE, RANDOM  Final   Special Requests NONE  Final   Culture   Final    NO GROWTH Performed at [facility], [place] [address] [place], [place] [address]    Report Status 02/22/2018 FINAL  Final      Studies: Ct Abdomen Pelvis W Contrast  Result Date: 02/21/2018 CLINICAL DATA:  Constipation and urinary retention for last 2 days.  EXAM: CT ABDOMEN AND PELVIS WITH CONTRAST TECHNIQUE: Multidetector CT imaging of the abdomen and pelvis was performed using the standard protocol following bolus administration of intravenous contrast. CONTRAST:  143mL OMNIPAQUE IOHEXOL 300 MG/ML  SOLN COMPARISON:  None. FINDINGS: Lower chest: Mild ground-glass attenuation of the right middle lobe and lingula, likely infectious/inflammatory. Hepatobiliary: No focal liver abnormality is seen. No gallstones, gallbladder wall thickening, or biliary dilatation. Pancreas: Unremarkable. No pancreatic ductal dilatation or surrounding inflammatory changes. Spleen: Normal in size without focal abnormality. Adrenals/Urinary Tract: Normal adrenal glands. Bilateral perinephric stranding, more prominent on the right. Bilateral benign-appearing renal cysts, the larger of of the superior pole of the left kidney measures 3 cm. No nephrolithiasis or hydronephrosis. Mild mucosal thickening and hyperenhancement of the urinary bladder, and possibly the ureters. Stomach/Bowel: Normal appearance of the stomach and small bowel. Scattered colonic diverticulosis without evidence of acute diverticulitis. Low-density circumscribed structure in the right pelvis measures 2.4 x 3.9 x 3.0 cm. It comes in contact with the cecum, inferior to the ileocecal valve, at the expected location of the appendix. Vascular/Lymphatic: Aortic atherosclerosis, mild. No enlarged abdominal or pelvic lymph nodes. Reproductive: Enlargement and heterogeneous enhancement  of the prostate gland, which measures 5.9 cm Other: No abdominal wall hernia or abnormality. No abdominopelvic ascites. Musculoskeletal: No acute or significant osseous findings. IMPRESSION: Bilateral perinephric stranding, hyperenhancement and mucosal thickening of the urinary bladder and the bilateral ureters. Enlargement and heterogeneous enhancement of the prostate gland. These findings may reflect complicated urinary tract infection/prostatitis,  or bladder outlet obstruction caused by the enlarged prostate gland. Please correlate to PSA values and urinalysis results. Low-density circumscribed structure adjacent to the distal cecum may represent a cystically dilated appendix with thickened walls. This may be secondary to an acute appendicitis or an appendiceal mucocele. Surgical consultation is recommended. Electronically Signed   By: Fidela Salisbury M.D.   On: 02/21/2018 15:59    Scheduled Meds: . nicotine  14 mg Transdermal Daily  . polyethylene glycol  17 g Oral Daily  . senna-docusate  1 tablet Oral Daily    Continuous Infusions: . ciprofloxacin Stopped (02/22/18 0524)   And  . metronidazole Stopped (02/22/18 0734)     LOS: 0 days     Alma Friendly, MD [facility]  If 7PM-7AM, please contact night-coverage [email or web address] 02/22/2018, 1:21 PM

## 2018-02-22 NOTE — Consult Note (Signed)
Urology Consult  Referring physician: Dr. Bonner Puna Reason for referral: Prostatitis, urinary retention  Chief Complaint: suprapubic pain  History of Present Illness: Adam Hinton is a 62yo with no significant PMH who presented to the ER yesterday with a 2 day history of constipation and difficulty urinating. He was having significant urinary urgency, frequnecy and dribbling. He denies any significant LUTS prior to this event 3 days ago. A On presentation to the ER he was found to have 1300cc in his bladder and had a foley placed. He underwent CT which showed an enlarge prostate and bilateral perinephric stranding. The patients suprapubic pain was sharp, constant, severe, and nonradiating. It was alleviated with foley catether placement. No other associated symptoms. No other exacerbating events  History reviewed. No pertinent past medical history. History reviewed. No pertinent surgical history.  Medications: I have reviewed the patient's current medications. Allergies: No Known Allergies  History reviewed. No pertinent family history. Social History:  reports that he has been smoking cigarettes. He has been smoking about 1.00 pack per day. He has never used smokeless tobacco. He reports that he drinks alcohol. He reports that he does not use drugs.  Review of Systems  Gastrointestinal: Positive for abdominal pain.  Genitourinary: Positive for frequency and urgency.  All other systems reviewed and are negative.   Physical Exam:  Vital signs in last 24 hours: Temp:  [98.3 F (36.8 C)-99.5 F (37.5 C)] 98.5 F (36.9 C) (10/20 1410) Pulse Rate:  [69-99] 77 (10/20 1410) Resp:  [16] 16 (10/20 0416) BP: (114-183)/(77-94) 114/77 (10/20 1410) SpO2:  [94 %-100 %] 97 % (10/20 1410) Physical Exam  Constitutional: He is oriented to person, place, and time. He appears well-developed and well-nourished.  HENT:  Head: Normocephalic and atraumatic.  Eyes: Pupils are equal, round, and reactive to  light.  Neck: Normal range of motion. No thyromegaly present.  Cardiovascular: Normal rate and regular rhythm.  Respiratory: Effort normal. No respiratory distress.  GI: Soft. He exhibits no distension. Hernia confirmed negative in the right inguinal area and confirmed negative in the left inguinal area.  Genitourinary: Testes normal and penis normal. Rectal exam shows anal tone normal. Prostate is enlarged and tender. Circumcised.  Musculoskeletal: Normal range of motion. He exhibits no edema.  Lymphadenopathy:       Right: No inguinal adenopathy present.       Left: No inguinal adenopathy present.  Neurological: He is alert and oriented to person, place, and time.  Skin: Skin is warm and dry.  Psychiatric: He has a normal mood and affect. His behavior is normal. Judgment and thought content normal.    Laboratory Data:  Results for orders placed or performed during the hospital encounter of 02/21/18 (from the past 72 hour(s))  Lipase, blood     Status: None   Collection Time: 02/21/18 10:48 AM  Result Value Ref Range   Lipase 28 11 - 51 U/L    Comment: Performed at [facility], [address] [place], [place] [address]  Comprehensive metabolic panel     Status: Abnormal   Collection Time: 02/21/18 10:48 AM  Result Value Ref Range   Sodium 137 135 - 145 mmol/L   Potassium 3.1 (L) 3.5 - 5.1 mmol/L   Chloride 100 98 - 111 mmol/L   CO2 28 22 - 32 mmol/L   Glucose, Bld 120 (H) 70 - 99 mg/dL   BUN 12 8 - 23 mg/dL   Creatinine, Ser 1.15 0.61 - 1.24 mg/dL  Calcium 9.5 8.9 - 10.3 mg/dL   Total Protein 7.5 6.5 - 8.1 g/dL   Albumin 4.2 3.5 - 5.0 g/dL   AST 43 (H) 15 - 41 U/L   ALT 16 0 - 44 U/L   Alkaline Phosphatase 63 38 - 126 U/L   Total Bilirubin 1.2 0.3 - 1.2 mg/dL   GFR calc non Af Amer >60 >60 mL/min   GFR calc Af Amer >60 >60 mL/min    Comment: (NOTE) The eGFR has been calculated using the CKD EPI equation. This calculation has not been validated in all clinical  situations. eGFR's persistently <60 mL/min signify possible Chronic Kidney Disease.    Anion gap 9 5 - 15    Comment: Performed at [facility] [address], [place], [place] [address]  CBC     Status: Abnormal   Collection Time: 02/21/18 10:48 AM  Result Value Ref Range   WBC 19.8 (H) 4.0 - 10.5 K/uL   RBC 5.17 4.22 - 5.81 MIL/uL   Hemoglobin 15.8 13.0 - 17.0 g/dL   HCT 47.6 39.0 - 52.0 %   MCV 92.1 80.0 - 100.0 fL   MCH 30.6 26.0 - 34.0 pg   MCHC 33.2 30.0 - 36.0 g/dL   RDW 13.3 11.5 - 15.5 %   Platelets 254 150 - 400 K/uL   nRBC 0.0 0.0 - 0.2 %    Comment: Performed at [facility], [place] [address], [place], [place] [address]  Urinalysis, Routine w reflex microscopic     Status: Abnormal   Collection Time: 02/21/18 11:45 AM  Result Value Ref Range   Color, Urine YELLOW YELLOW   APPearance CLEAR CLEAR   Specific Gravity, Urine 1.012 1.005 - 1.030   pH 6.0 5.0 - 8.0   Glucose, UA 50 (A) NEGATIVE mg/dL   Hgb urine dipstick MODERATE (A) NEGATIVE   Bilirubin Urine NEGATIVE NEGATIVE   Ketones, ur NEGATIVE NEGATIVE mg/dL   Protein, ur NEGATIVE NEGATIVE mg/dL   Nitrite NEGATIVE NEGATIVE   Leukocytes, UA NEGATIVE NEGATIVE   RBC / HPF 6-10 0 - 5 RBC/hpf   WBC, UA 0-5 0 - 5 WBC/hpf   Bacteria, UA NONE SEEN NONE SEEN   Squamous Epithelial / LPF 0-5 0 - 5   Mucus PRESENT     Comment: Performed at [facility], [place] [address], [place], [place] [address]  Urine culture     Status: None   Collection Time: 02/21/18  2:50 PM  Result Value Ref Range   Specimen Description URINE, RANDOM    Special Requests NONE    Culture      NO GROWTH Performed at [facility], [place] [address], [place], [place] [address]    Report Status 02/22/2018 FINAL   RPR     Status: None   Collection Time: 02/21/18  4:30 PM  Result Value Ref Range   RPR Ser Ql Non Reactive Non Reactive    Comment: (NOTE) Performed At: [facility] [place], [place]  [id number] Rush Farmer MD OM:[id number]   Basic metabolic panel     Status: Abnormal   Collection Time: 02/22/18  2:55 AM  Result Value Ref Range   Sodium 141 135 - 145 mmol/L   Potassium 3.2 (L) 3.5 - 5.1 mmol/L   Chloride 108 98 - 111 mmol/L   CO2 26 22 - 32 mmol/L   Glucose, Bld 112 (H) 70 - 99 mg/dL   BUN  9 8 - 23 mg/dL   Creatinine, Ser 0.94 0.61 - 1.24 mg/dL   Calcium 9.1 8.9 - 10.3 mg/dL   GFR calc non Af Amer >60 >60 mL/min   GFR calc Af Amer >60 >60 mL/min    Comment: (NOTE) The eGFR has been calculated using the CKD EPI equation. This calculation has not been validated in all clinical situations. eGFR's persistently <60 mL/min signify possible Chronic Kidney Disease.    Anion gap 7 5 - 15    Comment: Performed at [facility] [address] [place] [address]  CBC     Status: Abnormal   Collection Time: 02/22/18  2:55 AM  Result Value Ref Range   WBC 15.6 (H) 4.0 - 10.5 K/uL   RBC 4.63 4.22 - 5.81 MIL/uL   Hemoglobin 14.1 13.0 - 17.0 g/dL   HCT 41.9 39.0 - 52.0 %   MCV 90.5 80.0 - 100.0 fL   MCH 30.5 26.0 - 34.0 pg   MCHC 33.7 30.0 - 36.0 g/dL   RDW 13.2 11.5 - 15.5 %   Platelets 224 150 - 400 K/uL   nRBC 0.0 0.0 - 0.2 %    Comment: Performed at [facility], [place] [address], [place], [place] [address]   Recent Results (from the past 240 hour(s))  Urine culture     Status: None   Collection Time: 02/21/18  2:50 PM  Result Value Ref Range Status   Specimen Description URINE, RANDOM  Final   Special Requests NONE  Final   Culture   Final    NO GROWTH Performed at [facility], [address], [place], [place] [address]    Report Status 02/22/2018 FINAL  Final   Creatinine: Recent Labs    02/21/18 1048 02/22/18 0255  CREATININE 1.15 0.94   Baseline Creatinine: 1  Impression/Assessment:  62yo with BPH, urinary retention, UTI  Plan:  1. Please continue broad spectrum antibiotics pending urine culture 2. Please  continue foley catether to straight drain. I discussing the management of urinary retention with the patient including alpha blocker therapy with a trial of void in 1 week and the patient wishes to proceed. We will start flomax 0.46m daily and [facility] with [facility] trial  PNicolette Bang10/20/2019, 5:30 PM

## 2018-02-23 LAB — BASIC METABOLIC PANEL
ANION GAP: 6 (ref 5–15)
BUN: 9 mg/dL (ref 8–23)
CALCIUM: 8.6 mg/dL — AB (ref 8.9–10.3)
CHLORIDE: 106 mmol/L (ref 98–111)
CO2: 25 mmol/L (ref 22–32)
Creatinine, Ser: 0.83 mg/dL (ref 0.61–1.24)
GFR calc non Af Amer: >60 mL/min (ref >60)
Glucose, Bld: 92 mg/dL (ref 70–99)
POTASSIUM: 3.1 mmol/L — AB (ref 3.5–5.1)
Sodium: 137 mmol/L (ref 135–145)

## 2018-02-23 LAB — CBC WITH DIFFERENTIAL/PLATELET
ABS IMMATURE GRANULOCYTES: 0.03 10*3/uL (ref 0.00–0.07)
BASOS PCT: 0 %
Basophils Absolute: 0 10*3/uL (ref 0.0–0.1)
Eosinophils Absolute: 0.3 10*3/uL (ref 0.0–0.5)
Eosinophils Relative: 3 %
HCT: 40.5 % (ref 39.0–52.0)
HEMOGLOBIN: 13.6 g/dL (ref 13.0–17.0)
Immature Granulocytes: 0 %
LYMPHS PCT: 15 %
Lymphs Abs: 1.5 10*3/uL (ref 0.7–4.0)
MCH: 30.6 pg (ref 26.0–34.0)
MCHC: 33.6 g/dL (ref 30.0–36.0)
MCV: 91 fL (ref 80.0–100.0)
MONO ABS: 1.2 10*3/uL — AB (ref 0.1–1.0)
MONOS PCT: 12 %
NEUTROS ABS: 6.9 10*3/uL (ref 1.7–7.7)
Neutrophils Relative %: 70 %
Platelets: 194 10*3/uL (ref 150–400)
RBC: 4.45 MIL/uL (ref 4.22–5.81)
RDW: 13 % (ref 11.5–15.5)
WBC: 10 10*3/uL (ref 4.0–10.5)
nRBC: 0 % (ref 0.0–0.2)

## 2018-02-23 LAB — MAGNESIUM: Magnesium: 1.8 mg/dL (ref 1.7–2.4)

## 2018-02-23 LAB — GC/CHLAMYDIA PROBE AMP (~~LOC~~) NOT AT ARMC
CHLAMYDIA, DNA PROBE: NEGATIVE
Neisseria Gonorrhea: NEGATIVE

## 2018-02-23 MED ORDER — POTASSIUM CHLORIDE CRYS ER 20 MEQ PO TBCR
40.0000 meq | EXTENDED_RELEASE_TABLET | Freq: Two times a day (BID) | ORAL | Status: AC
  Administered 2018-02-23 (×2): 40 meq via ORAL
  Filled 2018-02-23 (×2): qty 2

## 2018-02-23 MED ORDER — PEG-KCL-NACL-NASULF-NA ASC-C 100 G PO SOLR
0.5000 | Freq: Once | ORAL | Status: AC
  Administered 2018-02-24: 100 g via ORAL
  Filled 2018-02-23: qty 1

## 2018-02-23 MED ORDER — PEG-KCL-NACL-NASULF-NA ASC-C 100 G PO SOLR
0.5000 | Freq: Once | ORAL | Status: AC
  Administered 2018-02-23: 100 g via ORAL
  Filled 2018-02-23 (×2): qty 1

## 2018-02-23 MED ORDER — SODIUM CHLORIDE 0.9 % IV SOLN: INTRAVENOUS | Status: DC | PRN

## 2018-02-23 MED ORDER — PEG-KCL-NACL-NASULF-NA ASC-C 100 G PO SOLR: 1.0000 | Freq: Once | ORAL | Status: DC

## 2018-02-23 NOTE — Progress Notes (Signed)
Patient ID: Adam Hinton, male   DOB: 1955-12-29, 62 y.o.   MRN: [id number]       Subjective: Pt is difficult to ask questions and get straightforward answers.  He has pressured speech with little breaks in between sentences.  I think I can gather that he isn't having RLQ abdominal pain.  His only pain is just above his penis.    Objective: Vital signs in last 24 hours: Temp:  [98.5 F (36.9 C)-98.8 F (37.1 C)] 98.6 F (37 C) (10/21 0548) Pulse Rate:  [63-77] 65 (10/21 0548) Resp:  [12-16] 12 (10/21 0548) BP: (114-123)/(77-80) 123/80 (10/21 0548) SpO2:  [96 %-97 %] 97 % (10/21 0548) Last BM Date: 02/21/18  Intake/Output from previous day: 10/20 0701 - 10/21 0700 In: 100 [IV Piggyback:100] Out: 650 [Urine:650] Intake/Output this shift: Total I/O In: -  Out: 475 [Urine:475]  PE: Heart: regular Lungs: CTAB Abd: soft, NT in RLQ, ND, +BS  Lab Results:  Recent Labs    02/22/18 0255 02/23/18 0349  WBC 15.6* 10.0  HGB 14.1 13.6  HCT 41.9 40.5  PLT 224 194   BMET Recent Labs    02/22/18 0255 02/23/18 0349  NA 141 137  K 3.2* 3.1*  CL 108 106  CO2 26 25  GLUCOSE 112* 92  BUN 9 9  CREATININE 0.94 0.83  CALCIUM 9.1 8.6*   PT/INR No results for input(s): LABPROT, INR in the last 72 hours. CMP     Component Value Date/Time   NA 137 02/23/2018 0349   K 3.1 (L) 02/23/2018 0349   CL 106 02/23/2018 0349   CO2 25 02/23/2018 0349   GLUCOSE 92 02/23/2018 0349   BUN 9 02/23/2018 0349   CREATININE 0.83 02/23/2018 0349   CALCIUM 8.6 (L) 02/23/2018 0349   PROT 7.5 02/21/2018 1048   ALBUMIN 4.2 02/21/2018 1048   AST 43 (H) 02/21/2018 1048   ALT 16 02/21/2018 1048   ALKPHOS 63 02/21/2018 1048   BILITOT 1.2 02/21/2018 1048   GFRNONAA >60 02/23/2018 0349   GFRAA >60 02/23/2018 0349   Lipase     Component Value Date/Time   LIPASE 28 02/21/2018 1048       Studies/Results: Ct Abdomen Pelvis W Contrast  Result Date: 02/21/2018 CLINICAL DATA:  Constipation  and urinary retention for last 2 days. EXAM: CT ABDOMEN AND PELVIS WITH CONTRAST TECHNIQUE: Multidetector CT imaging of the abdomen and pelvis was performed using the standard protocol following bolus administration of intravenous contrast. CONTRAST:  111mL OMNIPAQUE IOHEXOL 300 MG/ML  SOLN COMPARISON:  None. FINDINGS: Lower chest: Mild ground-glass attenuation of the right middle lobe and lingula, likely infectious/inflammatory. Hepatobiliary: No focal liver abnormality is seen. No gallstones, gallbladder wall thickening, or biliary dilatation. Pancreas: Unremarkable. No pancreatic ductal dilatation or surrounding inflammatory changes. Spleen: Normal in size without focal abnormality. Adrenals/Urinary Tract: Normal adrenal glands. Bilateral perinephric stranding, more prominent on the right. Bilateral benign-appearing renal cysts, the larger of of the superior pole of the left kidney measures 3 cm. No nephrolithiasis or hydronephrosis. Mild mucosal thickening and hyperenhancement of the urinary bladder, and possibly the ureters. Stomach/Bowel: Normal appearance of the stomach and small bowel. Scattered colonic diverticulosis without evidence of acute diverticulitis. Low-density circumscribed structure in the right pelvis measures 2.4 x 3.9 x 3.0 cm. It comes in contact with the cecum, inferior to the ileocecal valve, at the expected location of the appendix. Vascular/Lymphatic: Aortic atherosclerosis, mild. No enlarged abdominal or pelvic lymph nodes. Reproductive: Enlargement and heterogeneous  enhancement of the prostate gland, which measures 5.9 cm Other: No abdominal wall hernia or abnormality. No abdominopelvic ascites. Musculoskeletal: No acute or significant osseous findings. IMPRESSION: Bilateral perinephric stranding, hyperenhancement and mucosal thickening of the urinary bladder and the bilateral ureters. Enlargement and heterogeneous enhancement of the prostate gland. These findings may reflect  complicated urinary tract infection/prostatitis, or bladder outlet obstruction caused by the enlarged prostate gland. Please correlate to PSA values and urinalysis results. Low-density circumscribed structure adjacent to the distal cecum may represent a cystically dilated appendix with thickened walls. This may be secondary to an acute appendicitis or an appendiceal mucocele. Surgical consultation is recommended. Electronically Signed   By: Fidela Salisbury M.D.   On: 02/21/2018 15:59    Anti-infectives: Anti-infectives (From admission, onward)   Start     Dose/Rate Route Frequency Ordered Stop   02/22/18 0500  ciprofloxacin (CIPRO) IVPB 400 mg     400 mg 200 mL/hr over 60 Minutes Intravenous Every 12 hours 02/21/18 1835     02/21/18 2200  metroNIDAZOLE (FLAGYL) IVPB 500 mg     500 mg 100 mL/hr over 60 Minutes Intravenous Every 8 hours 02/21/18 1835     02/21/18 1615  ciprofloxacin (CIPRO) IVPB 400 mg     400 mg 200 mL/hr over 60 Minutes Intravenous  Once 02/21/18 1613 02/21/18 1842   02/21/18 1615  metroNIDAZOLE (FLAGYL) IVPB 500 mg     500 mg 100 mL/hr over 60 Minutes Intravenous  Once 02/21/18 1613 02/21/18 1842       Assessment/Plan Incidental find of appendiceal mucocele  -known history as this was found on a scan in [place] a couple of years ago.  Never received follow up. -no RLQ abdominal pain.   -patient will need outpatient colonoscopy and follow up with Dr. Barry Dienes to discuss interval appendectomy in the future. -no further surgical indications while here.  We will sign off and defer further care of urinary retention and prostatitis to primary service and urology.  Prostatitis/urinary retention - per primary and uro   LOS: 1 day    Henreitta Cea , [facility] 02/23/2018, 10:52 AM Pager: [phone]

## 2018-02-23 NOTE — Progress Notes (Signed)
[facility] Progress Note   Chief Complaint:   Appendiceal mass on CT scan  SUBJECTIVE:    no complaints.    ASSESSMENT AND PLAN:   1. Appendiceal mass, ? Mucocele. Sounds like this was initially discovered in [place] a few years ago. Surgery has recommended colonoscopy prior to elective laparoscopic appendectomy (outpatient).  -will prep today for colonoscopy tomorrow. The risks and benefits of colonoscopy with possible polypectomy were discussed and the patient agrees to proceed.   2. Prostatitis / urinary retention. Has foley now. On antibiotics.   3. Mild hypokalemia, repletion in progress 3  OBJECTIVE:     Vital signs in last 24 hours: Temp:  [98.5 F (36.9 C)-98.8 F (37.1 C)] 98.6 F (37 C) (10/21 0548) Pulse Rate:  [63-77] 65 (10/21 0548) Resp:  [12-16] 12 (10/21 0548) BP: (114-123)/(77-80) 123/80 (10/21 0548) SpO2:  [96 %-97 %] 97 % (10/21 0548) Last BM Date: 02/21/18 General:   Alert, well-developed,  Male in NAD EENT:  Normal hearing, non icteric sclera, conjunctive pink.  Heart:  Regular rate and rhythm, no lower extremity edema Pulm: Normal respiratory effort Abdomen:  Soft, nondistended, nontender.  Normal bowel sounds, no masses felt.     Neurologic:  Alert and  oriented x4;  grossly normal neurologically. Psych:  Pleasant, cooperative.  Normal mood and affect.   Intake/Output from previous day: 10/20 0701 - 10/21 0700 In: 100 [IV Piggyback:100] Out: 650 [Urine:650] Intake/Output this shift: Total I/O In: -  Out: 475 [Urine:475]  Lab Results: Recent Labs    02/21/18 1048 02/22/18 0255 02/23/18 0349  WBC 19.8* 15.6* 10.0  HGB 15.8 14.1 13.6  HCT 47.6 41.9 40.5  PLT 254 224 194   BMET Recent Labs    02/21/18 1048 02/22/18 0255 02/23/18 0349  NA 137 141 137  K 3.1* 3.2* 3.1*  CL 100 108 106  CO2 28 26 25   GLUCOSE 120* 112* 92  BUN 12 9 9   CREATININE 1.15 0.94 0.83  CALCIUM 9.5 9.1 8.6*   LFT Recent Labs   02/21/18 1048  PROT 7.5  ALBUMIN 4.2  AST 43*  ALT 16  ALKPHOS 63  BILITOT 1.2    Ct Abdomen Pelvis W Contrast  Result Date: 02/21/2018 CLINICAL DATA:  Constipation and urinary retention for last 2 days. EXAM: CT ABDOMEN AND PELVIS WITH CONTRAST TECHNIQUE: Multidetector CT imaging of the abdomen and pelvis was performed using the standard protocol following bolus administration of intravenous contrast. CONTRAST:  165mL OMNIPAQUE IOHEXOL 300 MG/ML  SOLN COMPARISON:  None. FINDINGS: Lower chest: Mild ground-glass attenuation of the right middle lobe and lingula, likely infectious/inflammatory. Hepatobiliary: No focal liver abnormality is seen. No gallstones, gallbladder wall thickening, or biliary dilatation. Pancreas: Unremarkable. No pancreatic ductal dilatation or surrounding inflammatory changes. Spleen: Normal in size without focal abnormality. Adrenals/Urinary Tract: Normal adrenal glands. Bilateral perinephric stranding, more prominent on the right. Bilateral benign-appearing renal cysts, the larger of of the superior pole of the left kidney measures 3 cm. No nephrolithiasis or hydronephrosis. Mild mucosal thickening and hyperenhancement of the urinary bladder, and possibly the ureters. Stomach/Bowel: Normal appearance of the stomach and small bowel. Scattered colonic diverticulosis without evidence of acute diverticulitis. Low-density circumscribed structure in the right pelvis measures 2.4 x 3.9 x 3.0 cm. It comes in contact with the cecum, inferior to the ileocecal valve, at the expected location of the appendix. Vascular/Lymphatic: Aortic atherosclerosis, mild. No enlarged abdominal or pelvic lymph nodes. Reproductive: Enlargement and heterogeneous enhancement  of the prostate gland, which measures 5.9 cm Other: No abdominal wall hernia or abnormality. No abdominopelvic ascites. Musculoskeletal: No acute or significant osseous findings. IMPRESSION: Bilateral perinephric stranding,  hyperenhancement and mucosal thickening of the urinary bladder and the bilateral ureters. Enlargement and heterogeneous enhancement of the prostate gland. These findings may reflect complicated urinary tract infection/prostatitis, or bladder outlet obstruction caused by the enlarged prostate gland. Please correlate to PSA values and urinalysis results. Low-density circumscribed structure adjacent to the distal cecum may represent a cystically dilated appendix with thickened walls. This may be secondary to an acute appendicitis or an appendiceal mucocele. Surgical consultation is recommended. Electronically Signed   By: Fidela Salisbury M.D.   On: 02/21/2018 15:59     Active Problems:   Prostatitis   Bladder outlet obstruction   Mucocele of appendix   Constipation   Tobacco use   Hypokalemia   Suprapubic pain   Urinary obstruction   Abnormal CT scan, colon   Benign neoplasm of appendix     LOS: 1 day   Tye Savoy ,NP 02/23/2018, 12:57 PM

## 2018-02-23 NOTE — H&P (View-Only) (Signed)
[facility] Progress Note   Chief Complaint:   Appendiceal mass on CT scan  SUBJECTIVE:    no complaints.    ASSESSMENT AND PLAN:   1. Appendiceal mass, ? Mucocele. Sounds like this was initially discovered in [place] a few years ago. Surgery has recommended colonoscopy prior to elective laparoscopic appendectomy (outpatient).  -will prep today for colonoscopy tomorrow. The risks and benefits of colonoscopy with possible polypectomy were discussed and the patient agrees to proceed.   2. Prostatitis / urinary retention. Has foley now. On antibiotics.   3. Mild hypokalemia, repletion in progress 3  OBJECTIVE:     Vital signs in last 24 hours: Temp:  [98.5 F (36.9 C)-98.8 F (37.1 C)] 98.6 F (37 C) (10/21 0548) Pulse Rate:  [63-77] 65 (10/21 0548) Resp:  [12-16] 12 (10/21 0548) BP: (114-123)/(77-80) 123/80 (10/21 0548) SpO2:  [96 %-97 %] 97 % (10/21 0548) Last BM Date: 02/21/18 General:   Alert, well-developed,  Male in NAD EENT:  Normal hearing, non icteric sclera, conjunctive pink.  Heart:  Regular rate and rhythm, no lower extremity edema Pulm: Normal respiratory effort Abdomen:  Soft, nondistended, nontender.  Normal bowel sounds, no masses felt.     Neurologic:  Alert and  oriented x4;  grossly normal neurologically. Psych:  Pleasant, cooperative.  Normal mood and affect.   Intake/Output from previous day: 10/20 0701 - 10/21 0700 In: 100 [IV Piggyback:100] Out: 650 [Urine:650] Intake/Output this shift: Total I/O In: -  Out: 475 [Urine:475]  Lab Results: Recent Labs    02/21/18 1048 02/22/18 0255 02/23/18 0349  WBC 19.8* 15.6* 10.0  HGB 15.8 14.1 13.6  HCT 47.6 41.9 40.5  PLT 254 224 194   BMET Recent Labs    02/21/18 1048 02/22/18 0255 02/23/18 0349  NA 137 141 137  K 3.1* 3.2* 3.1*  CL 100 108 106  CO2 28 26 25   GLUCOSE 120* 112* 92  BUN 12 9 9   CREATININE 1.15 0.94 0.83  CALCIUM 9.5 9.1 8.6*   LFT Recent Labs   02/21/18 1048  PROT 7.5  ALBUMIN 4.2  AST 43*  ALT 16  ALKPHOS 63  BILITOT 1.2    Ct Abdomen Pelvis W Contrast  Result Date: 02/21/2018 CLINICAL DATA:  Constipation and urinary retention for last 2 days. EXAM: CT ABDOMEN AND PELVIS WITH CONTRAST TECHNIQUE: Multidetector CT imaging of the abdomen and pelvis was performed using the standard protocol following bolus administration of intravenous contrast. CONTRAST:  115mL OMNIPAQUE IOHEXOL 300 MG/ML  SOLN COMPARISON:  None. FINDINGS: Lower chest: Mild ground-glass attenuation of the right middle lobe and lingula, likely infectious/inflammatory. Hepatobiliary: No focal liver abnormality is seen. No gallstones, gallbladder wall thickening, or biliary dilatation. Pancreas: Unremarkable. No pancreatic ductal dilatation or surrounding inflammatory changes. Spleen: Normal in size without focal abnormality. Adrenals/Urinary Tract: Normal adrenal glands. Bilateral perinephric stranding, more prominent on the right. Bilateral benign-appearing renal cysts, the larger of of the superior pole of the left kidney measures 3 cm. No nephrolithiasis or hydronephrosis. Mild mucosal thickening and hyperenhancement of the urinary bladder, and possibly the ureters. Stomach/Bowel: Normal appearance of the stomach and small bowel. Scattered colonic diverticulosis without evidence of acute diverticulitis. Low-density circumscribed structure in the right pelvis measures 2.4 x 3.9 x 3.0 cm. It comes in contact with the cecum, inferior to the ileocecal valve, at the expected location of the appendix. Vascular/Lymphatic: Aortic atherosclerosis, mild. No enlarged abdominal or pelvic lymph nodes. Reproductive: Enlargement and heterogeneous enhancement  of the prostate gland, which measures 5.9 cm Other: No abdominal wall hernia or abnormality. No abdominopelvic ascites. Musculoskeletal: No acute or significant osseous findings. IMPRESSION: Bilateral perinephric stranding,  hyperenhancement and mucosal thickening of the urinary bladder and the bilateral ureters. Enlargement and heterogeneous enhancement of the prostate gland. These findings may reflect complicated urinary tract infection/prostatitis, or bladder outlet obstruction caused by the enlarged prostate gland. Please correlate to PSA values and urinalysis results. Low-density circumscribed structure adjacent to the distal cecum may represent a cystically dilated appendix with thickened walls. This may be secondary to an acute appendicitis or an appendiceal mucocele. Surgical consultation is recommended. Electronically Signed   By: Fidela Salisbury M.D.   On: 02/21/2018 15:59     Active Problems:   Prostatitis   Bladder outlet obstruction   Mucocele of appendix   Constipation   Tobacco use   Hypokalemia   Suprapubic pain   Urinary obstruction   Abnormal CT scan, colon   Benign neoplasm of appendix     LOS: 1 day   Tye Savoy ,NP 02/23/2018, 12:57 PM

## 2018-02-23 NOTE — Progress Notes (Signed)
PROGRESS NOTE  Elisa Sorlie AQT:[id number] DOB: [birth year] DOA: 02/21/2018 PCP: Patient, No Pcp Per  HPI/Recap of past 24 hours: HPI from Dr Randel Pigg is a 62 y.o. male with no medical follow up or known PMH who presented to the ED on the advise of urgent care MD due to urinary retention and suprapubic pain. He reports noticing gradual onset of pressure-type suprapubic pain about 2 nights ago, which remained constant, waxing/waning and is associated with severe urinary hesitancy and small volume urinary incontinence and constipation, no dysuria. In the ED, pt was unable to urinate so I/O catheterization was performed for urinalysis, yielding 1300cc urine which had no WBCs or bacteria, 6-10 RBCs. A foley catheter was eventually inserted after bladder scan showed persistent retention. CT abdomen/pelvis performed showed a circumscribed low density structure in the expected location of the appendix that was 2.4x3.9x3.0cm, diverticulosis without diverticulitis, and R > L perinephric stranding without hydronephrosis, ureteral and bladder wall enhancement and enlarged (5.9cm) heterogeneously enhancing prostate. Concern was for appendicitis vs. mucocele. Surgery, GI and urology consulted. Cipro/flagyl started. Pt admitted for further management.   Today, pt has no new complaints, continues to be very talkative, sometimes hard to re-direct.  Assessment/Plan: Active Problems:   Prostatitis   Bladder outlet obstruction   Mucocele of appendix   Constipation   Tobacco use   Hypokalemia   Suprapubic pain   Urinary obstruction   Abnormal CT scan, colon   Benign neoplasm of appendix  Bladder outlet obstruction, ?prostatitis Afebrile, resolved leukocytosis UA unremarkable, UC no growth CT abdomen/pelvis performed showed bilateral perinephric stranding without hydronephrosis, ureteral and bladder wall enhancement and enlarged (5.9cm) heterogeneously enhancing prostate Continue  foley Continue cipro/flagyl for now Urology consulted, appreciate recs: follow up as an outpt for voiding trial. Start flomax  Appendiceal mucocele Vs acute appendicitis CT showed above General surgery consulted, wants GI to possibly perform a colonoscopy prior to any possible surgical intervention. Planning for appendectomy in the future. Signed off GI on board: plan for colonoscopy 02/24/18, start prep today  Constipation Continue senna, miralax, prn ducolax  Hypokalemia Replace prn  Tobacco abuse Advised to quit Nicotine patch       Code Status: Full  Family Communication: None at bedside  Disposition Plan: TBD   Consultants:  Urology  GI  Gen Surg  Procedures:  None  Antimicrobials:  Flagyl  Cipro  DVT prophylaxis: SCDs pending colonoscopy   Objective: Vitals:   02/22/18 0416 02/22/18 1410 02/22/18 2220 02/23/18 0548  BP: (!) 146/80 114/77 118/80 123/80  Pulse: 82 77 63 65  Resp: 16  16 12   Temp: 99.5 F (37.5 C) 98.5 F (36.9 C) 98.8 F (37.1 C) 98.6 F (37 C)  TempSrc: Oral Oral Oral Oral  SpO2: 96% 97% 96% 97%  Weight:      Height:        Intake/Output Summary (Last 24 hours) at 02/23/2018 1604 Last data filed at 02/23/2018 0705 Gross per 24 hour  Intake -  Output 1125 ml  Net -1125 ml   Filed Weights   02/21/18 1042  Weight: 68 kg    Exam:   General: NAD, talkative   Cardiovascular: S1, S2 present   Respiratory: CTAB  Abdomen: Soft, NT, ND, BS present, foley catheter draining dark colored urine  Musculoskeletal: No pedal edema bilaterally   Skin: Normal  Psychiatry: Normal mood   Data Reviewed: CBC: Recent Labs  Lab 02/21/18 1048 02/22/18 0255 02/23/18 0349  WBC 19.8* 15.6*  10.0  NEUTROABS  --   --  6.9  HGB 15.8 14.1 13.6  HCT 47.6 41.9 40.5  MCV 92.1 90.5 91.0  PLT 254 224 384   Basic Metabolic Panel: Recent Labs  Lab 02/21/18 1048 02/22/18 0255 02/23/18 0349  NA 137 141 137  K 3.1* 3.2*  3.1*  CL 100 108 106  CO2 28 26 25   GLUCOSE 120* 112* 92  BUN 12 9 9   CREATININE 1.15 0.94 0.83  CALCIUM 9.5 9.1 8.6*  MG  --   --  1.8   GFR: Estimated Creatinine Clearance: 87.6 mL/min (by C-G formula based on SCr of 0.83 mg/dL). Liver Function Tests: Recent Labs  Lab 02/21/18 1048  AST 43*  ALT 16  ALKPHOS 63  BILITOT 1.2  PROT 7.5  ALBUMIN 4.2   Recent Labs  Lab 02/21/18 1048  LIPASE 28   No results for input(s): AMMONIA in the last 168 hours. Coagulation Profile: No results for input(s): INR, PROTIME in the last 168 hours. Cardiac Enzymes: No results for input(s): CKTOTAL, CKMB, CKMBINDEX, TROPONINI in the last 168 hours. BNP (last 3 results) No results for input(s): PROBNP in the last 8760 hours. HbA1C: No results for input(s): HGBA1C in the last 72 hours. CBG: No results for input(s): GLUCAP in the last 168 hours. Lipid Profile: No results for input(s): CHOL, HDL, LDLCALC, TRIG, CHOLHDL, LDLDIRECT in the last 72 hours. Thyroid Function Tests: No results for input(s): TSH, T4TOTAL, FREET4, T3FREE, THYROIDAB in the last 72 hours. Anemia Panel: No results for input(s): VITAMINB12, FOLATE, FERRITIN, TIBC, IRON, RETICCTPCT in the last 72 hours. Urine analysis:    Component Value Date/Time   COLORURINE YELLOW 02/21/2018 1145   APPEARANCEUR CLEAR 02/21/2018 1145   LABSPEC 1.012 02/21/2018 1145   PHURINE 6.0 02/21/2018 1145   GLUCOSEU 50 (A) 02/21/2018 1145   HGBUR MODERATE (A) 02/21/2018 1145   BILIRUBINUR NEGATIVE 02/21/2018 1145   KETONESUR NEGATIVE 02/21/2018 1145   PROTEINUR NEGATIVE 02/21/2018 1145   NITRITE NEGATIVE 02/21/2018 1145   LEUKOCYTESUR NEGATIVE 02/21/2018 1145   Sepsis Labs: @LABRCNTIP (procalcitonin:4,lacticidven:4)  ) Recent Results (from the past 240 hour(s))  Urine culture     Status: None   Collection Time: 02/21/18  2:50 PM  Result Value Ref Range Status   Specimen Description URINE, RANDOM  Final   Special Requests NONE  Final    Culture   Final    NO GROWTH Performed at [facility], [place] [address], [place], [place] [address]    Report Status 02/22/2018 FINAL  Final      Studies: No results found.  Scheduled Meds: . nicotine  14 mg Transdermal Daily  . peg 3350 powder  0.5 kit Oral Once   And  . [START ON 02/24/2018] peg 3350 powder  0.5 kit Oral Once  . polyethylene glycol  17 g Oral Daily  . potassium chloride  40 mEq Oral BID  . senna-docusate  1 tablet Oral Daily  . tamsulosin  0.4 mg Oral QPC supper    Continuous Infusions: . sodium chloride    . ciprofloxacin 400 mg (02/23/18 0538)   And  . metronidazole 500 mg (02/23/18 1504)     LOS: 1 day     Alma Friendly, MD [facility]  If 7PM-7AM, please contact night-coverage [email or web address] 02/23/2018, 4:04 PM

## 2018-02-24 ENCOUNTER — Inpatient Hospital Stay (HOSPITAL_COMMUNITY): Payer: BLUE CROSS/BLUE SHIELD | Admitting: Certified Registered Nurse Anesthetist

## 2018-02-24 ENCOUNTER — Encounter (HOSPITAL_COMMUNITY): Payer: Self-pay | Admitting: Certified Registered Nurse Anesthetist

## 2018-02-24 ENCOUNTER — Encounter (HOSPITAL_COMMUNITY)
Admission: EM | Disposition: A | Discharge status: Home / Self Care | Payer: Self-pay | Source: Home / Self Care | Attending: Internal Medicine

## 2018-02-24 DIAGNOSIS — D123 Benign neoplasm of transverse colon: Secondary | ICD-10-CM

## 2018-02-24 DIAGNOSIS — K6389 Other specified diseases of intestine: Secondary | ICD-10-CM

## 2018-02-24 DIAGNOSIS — K644 Residual hemorrhoidal skin tags: Secondary | ICD-10-CM

## 2018-02-24 DIAGNOSIS — D120 Benign neoplasm of cecum: Secondary | ICD-10-CM

## 2018-02-24 DIAGNOSIS — D128 Benign neoplasm of rectum: Secondary | ICD-10-CM

## 2018-02-24 DIAGNOSIS — D124 Benign neoplasm of descending colon: Secondary | ICD-10-CM

## 2018-02-24 HISTORY — PX: POLYPECTOMY: SHX5525

## 2018-02-24 HISTORY — PX: COLONOSCOPY WITH PROPOFOL: SHX5780

## 2018-02-24 LAB — CBC WITH DIFFERENTIAL/PLATELET
Abs Immature Granulocytes: 0.02 10*3/uL (ref 0.00–0.07)
BASOS PCT: 0 %
Basophils Absolute: 0 10*3/uL (ref 0.0–0.1)
EOS ABS: 0.2 10*3/uL (ref 0.0–0.5)
EOS PCT: 2 %
HCT: 41.5 % (ref 39.0–52.0)
Hemoglobin: 13.8 g/dL (ref 13.0–17.0)
IMMATURE GRANULOCYTES: 0 %
Lymphocytes Relative: 14 %
Lymphs Abs: 1.2 10*3/uL (ref 0.7–4.0)
MCH: 30.4 pg (ref 26.0–34.0)
MCHC: 33.3 g/dL (ref 30.0–36.0)
MCV: 91.4 fL (ref 80.0–100.0)
MONOS PCT: 12 %
Monocytes Absolute: 1 10*3/uL (ref 0.1–1.0)
NEUTROS PCT: 72 %
NRBC: 0 % (ref 0.0–0.2)
Neutro Abs: 6.5 10*3/uL (ref 1.7–7.7)
PLATELETS: 209 10*3/uL (ref 150–400)
RBC: 4.54 MIL/uL (ref 4.22–5.81)
RDW: 12.8 % (ref 11.5–15.5)
WBC: 9 10*3/uL (ref 4.0–10.5)

## 2018-02-24 LAB — BASIC METABOLIC PANEL
ANION GAP: 10 (ref 5–15)
BUN: 7 mg/dL — AB (ref 8–23)
CALCIUM: 9 mg/dL (ref 8.9–10.3)
CO2: 23 mmol/L (ref 22–32)
CREATININE: 0.77 mg/dL (ref 0.61–1.24)
Chloride: 105 mmol/L (ref 98–111)
GFR calc Af Amer: >60 mL/min (ref >60)
GFR calc non Af Amer: >60 mL/min (ref >60)
GLUCOSE: 81 mg/dL (ref 70–99)
Potassium: 3.7 mmol/L (ref 3.5–5.1)
Sodium: 138 mmol/L (ref 135–145)

## 2018-02-24 LAB — HIV ANTIBODY (ROUTINE TESTING W REFLEX): HIV Screen 4th Generation wRfx: NONREACTIVE

## 2018-02-24 SURGERY — COLONOSCOPY WITH PROPOFOL
Anesthesia: Monitor Anesthesia Care

## 2018-02-24 MED ORDER — EPINEPHRINE PF 1 MG/10ML IJ SOSY
PREFILLED_SYRINGE | INTRAMUSCULAR | Status: AC
  Filled 2018-02-24: qty 10

## 2018-02-24 MED ORDER — LACTATED RINGERS IV SOLN
INTRAVENOUS | Status: DC | PRN
  Administered 2018-02-24 (×2): via INTRAVENOUS

## 2018-02-24 MED ORDER — PROPOFOL 10 MG/ML IV BOLUS
INTRAVENOUS | Status: DC | PRN
  Administered 2018-02-24: 50 mg via INTRAVENOUS
  Administered 2018-02-24: 30 mg via INTRAVENOUS
  Administered 2018-02-24: 20 mg via INTRAVENOUS

## 2018-02-24 MED ORDER — PROPOFOL 500 MG/50ML IV EMUL
INTRAVENOUS | Status: DC | PRN
  Administered 2018-02-24: 150 ug/kg/min via INTRAVENOUS

## 2018-02-24 MED ORDER — SODIUM CHLORIDE 0.9 % IJ SOLN
PREFILLED_SYRINGE | INTRAMUSCULAR | Status: DC | PRN
  Administered 2018-02-24: 2 mL

## 2018-02-24 SURGICAL SUPPLY — 22 items

## 2018-02-24 NOTE — Progress Notes (Signed)
Loss of iv at shift change, 0600 Flagyl iv still need to be administered after notification to rx for missing dose.  Day shift nurse made aware and will follow up.

## 2018-02-24 NOTE — Progress Notes (Signed)
PROGRESS NOTE  Humzah Harty FYB:[id number] DOB: [birth year] DOA: 02/21/2018 PCP: Patient, No Pcp Per  HPI/Recap of past 24 hours: HPI from Dr Randel Pigg is a 62 y.o. male with no medical follow up or known PMH who presented to the ED on the advise of urgent care MD due to urinary retention and suprapubic pain. He reports noticing gradual onset of pressure-type suprapubic pain about 2 nights ago, which remained constant, waxing/waning and is associated with severe urinary hesitancy and small volume urinary incontinence and constipation, no dysuria. In the ED, pt was unable to urinate so I/O catheterization was performed for urinalysis, yielding 1300cc urine which had no WBCs or bacteria, 6-10 RBCs. A foley catheter was eventually inserted after bladder scan showed persistent retention. CT abdomen/pelvis performed showed a circumscribed low density structure in the expected location of the appendix that was 2.4x3.9x3.0cm, diverticulosis without diverticulitis, and R > L perinephric stranding without hydronephrosis, ureteral and bladder wall enhancement and enlarged (5.9cm) heterogeneously enhancing prostate. Concern was for appendicitis vs. mucocele. Surgery, GI and urology consulted. Cipro/flagyl started. Pt admitted for further management.   Today, denies any new complaints. Saw pt after colonoscopy, denies any abdominal pain, fever/chills or rectal bleed  Assessment/Plan: Active Problems:   Prostatitis   Bladder outlet obstruction   Mucocele of appendix   Constipation   Tobacco use   Hypokalemia   Suprapubic pain   Urinary obstruction   Abnormal CT scan, colon   Benign neoplasm of appendix  Bladder outlet obstruction, ?prostatitis Afebrile, resolved leukocytosis UA unremarkable, UC no growth CT abdomen/pelvis performed showed bilateral perinephric stranding without hydronephrosis, ureteral and bladder wall enhancement and enlarged (5.9cm) heterogeneously enhancing  prostate Continue foley Continue cipro/flagyl for likely 5-7 days as UC showed no growth (questionable if really prostatitis) Urology consulted, appreciate recs: follow up as an outpt within 7 days with foley for voiding trial. Start flomax  Appendiceal mucocele CT showed above General surgery consulted, wants GI to possibly perform a colonoscopy prior to any possible surgical intervention. Planning for appendectomy in the future. Signed off GI on board: Colonoscopy done on 02/24/18 showed: -Partially inverted appendiceal orifice was found. -Three 2 to 4 mm polyps in the rectum and in the cecum, removed with a cold snare Resected and retrieved. -One 15 mm polyp in the transverse colon, 18 mm in descending colon resected -Diverticulosis of the Ascending Colon and the Sigmoid Colon and the Rectosigmoid Colon -Non-bleeding non-thrombosed external and internal hemorrhoids  Observe overnight for post-polypectomy pain/bleed Follow up with GI and Gen surg as an outpt  Constipation Continue senna, miralax, prn ducolax  Hypokalemia Replace prn  Tobacco abuse Advised to quit Nicotine patch       Code Status: Full  Family Communication: None at bedside  Disposition Plan: Likely d/c on 02/25/18   Consultants:  Urology  GI  Gen Surg  Procedures:  Colonoscopy on 02/24/18  Antimicrobials:  Flagyl  Cipro  DVT prophylaxis: SCDs s/p colonoscopy   Objective: Vitals:   02/24/18 0705 02/24/18 0821 02/24/18 0937 02/24/18 1024  BP: 123/71 138/80 124/89 (!) 157/79  Pulse: (!) 56 62 (!) 58 (!) 47  Resp: 16 16 17 16   Temp: 98.4 F (36.9 C) 98.6 F (37 C) 98 F (36.7 C) (!) 97.5 F (36.4 C)  TempSrc: Oral Oral Oral Oral  SpO2: 99% 98% 100% 99%  Weight:      Height:        Intake/Output Summary (Last 24 hours) at 02/24/2018 1446  Last data filed at 02/24/2018 6063 Gross per 24 hour  Intake 1000 ml  Output 3700 ml  Net -2700 ml   Filed Weights   02/21/18 1042   Weight: 68 kg    Exam:   General: NAD, talkative   Cardiovascular: S1, S2 present   Respiratory: CTAB  Abdomen: Soft, NT, ND, BS present, foley catheter present draining clear urine  Musculoskeletal: No pedal edema bilaterally   Skin: Normal  Psychiatry: Normal mood   Data Reviewed: CBC: Recent Labs  Lab 02/21/18 1048 02/22/18 0255 02/23/18 0349 02/24/18 0225  WBC 19.8* 15.6* 10.0 9.0  NEUTROABS  --   --  6.9 6.5  HGB 15.8 14.1 13.6 13.8  HCT 47.6 41.9 40.5 41.5  MCV 92.1 90.5 91.0 91.4  PLT 254 224 194 016   Basic Metabolic Panel: Recent Labs  Lab 02/21/18 1048 02/22/18 0255 02/23/18 0349 02/24/18 0225  NA 137 141 137 138  K 3.1* 3.2* 3.1* 3.7  CL 100 108 106 105  CO2 28 26 25 23   GLUCOSE 120* 112* 92 81  BUN 12 9 9  7*  CREATININE 1.15 0.94 0.83 0.77  CALCIUM 9.5 9.1 8.6* 9.0  MG  --   --  1.8  --    GFR: Estimated Creatinine Clearance: 90.9 mL/min (by C-G formula based on SCr of 0.77 mg/dL). Liver Function Tests: Recent Labs  Lab 02/21/18 1048  AST 43*  ALT 16  ALKPHOS 63  BILITOT 1.2  PROT 7.5  ALBUMIN 4.2   Recent Labs  Lab 02/21/18 1048  LIPASE 28   No results for input(s): AMMONIA in the last 168 hours. Coagulation Profile: No results for input(s): INR, PROTIME in the last 168 hours. Cardiac Enzymes: No results for input(s): CKTOTAL, CKMB, CKMBINDEX, TROPONINI in the last 168 hours. BNP (last 3 results) No results for input(s): PROBNP in the last 8760 hours. HbA1C: No results for input(s): HGBA1C in the last 72 hours. CBG: No results for input(s): GLUCAP in the last 168 hours. Lipid Profile: No results for input(s): CHOL, HDL, LDLCALC, TRIG, CHOLHDL, LDLDIRECT in the last 72 hours. Thyroid Function Tests: No results for input(s): TSH, T4TOTAL, FREET4, T3FREE, THYROIDAB in the last 72 hours. Anemia Panel: No results for input(s): VITAMINB12, FOLATE, FERRITIN, TIBC, IRON, RETICCTPCT in the last 72 hours. Urine analysis:     Component Value Date/Time   COLORURINE YELLOW 02/21/2018 1145   APPEARANCEUR CLEAR 02/21/2018 1145   LABSPEC 1.012 02/21/2018 1145   PHURINE 6.0 02/21/2018 1145   GLUCOSEU 50 (A) 02/21/2018 1145   HGBUR MODERATE (A) 02/21/2018 1145   BILIRUBINUR NEGATIVE 02/21/2018 1145   KETONESUR NEGATIVE 02/21/2018 1145   PROTEINUR NEGATIVE 02/21/2018 1145   NITRITE NEGATIVE 02/21/2018 1145   LEUKOCYTESUR NEGATIVE 02/21/2018 1145   Sepsis Labs: @LABRCNTIP (procalcitonin:4,lacticidven:4)  ) Recent Results (from the past 240 hour(s))  Urine culture     Status: None   Collection Time: 02/21/18  2:50 PM  Result Value Ref Range Status   Specimen Description URINE, RANDOM  Final   Special Requests NONE  Final   Culture   Final    NO GROWTH Performed at [facility], [place] [address], [place], [place] [address]    Report Status 02/22/2018 FINAL  Final      Studies: No results found.  Scheduled Meds: . nicotine  14 mg Transdermal Daily  . polyethylene glycol  17 g Oral Daily  . senna-docusate  1 tablet Oral Daily  . tamsulosin  0.4  mg Oral QPC supper    Continuous Infusions: . sodium chloride    . ciprofloxacin 400 mg (02/24/18 0618)   And  . metronidazole Stopped (02/24/18 0752)     LOS: 2 days     Alma Friendly, MD [facility]  If 7PM-7AM, please contact night-coverage [email or web address] 02/24/2018, 2:46 PM

## 2018-02-24 NOTE — Transfer of Care (Signed)
Immediate Anesthesia Transfer of Care Note  Patient: Adam Hinton  Procedure(s) Performed: COLONOSCOPY WITH PROPOFOL (N/A ) POLYPECTOMY HEMOSTASIS CONTROL SCLEROTHERAPY  Patient Location: Endoscopy Unit  Anesthesia Type:MAC  Level of Consciousness: drowsy and patient cooperative  Airway & Oxygen Therapy: Patient Spontanous Breathing and Patient connected to face mask oxygen  Post-op Assessment: Report given to RN and Post -op Vital signs reviewed and stable  Post vital signs: Reviewed and stable  Last Vitals:  Vitals Value Taken Time  BP    Temp    Pulse 54 02/24/2018  9:40 AM  Resp 20 02/24/2018  9:40 AM  SpO2 93 % 02/24/2018  9:40 AM  Vitals shown include unvalidated device data.  Last Pain:  Vitals:   02/24/18 0821  TempSrc: Oral  PainSc: 0-No pain      Patients Stated Pain Goal: 0 (02/72/53 6644)  Complications: No apparent anesthesia complications

## 2018-02-24 NOTE — Interval H&P Note (Signed)
History and Physical Interval Note:  02/24/2018 8:27 AM  Adam Hinton  has presented today for surgery, with the diagnosis of abnormal CT scan, appendiceal mass  The various methods of treatment have been discussed with the patient and family. After consideration of risks, benefits and other options for treatment, the patient has consented to  Procedure(s): COLONOSCOPY WITH PROPOFOL (N/A) as a surgical intervention .  The patient's history has been reviewed, patient examined, no change in status, stable for surgery.  I have reviewed the patient's chart and labs.  Questions were answered to the patient's satisfaction.    The risks and benefits of endoscopic evaluation were discussed with the patient; these include but are not limited to the risk of perforation, infection, bleeding, missed lesions, lack of diagnosis, severe illness requiring hospitalization, as well as anesthesia and sedation related illnesses.  The patient is agreeable to proceed.     [employer]

## 2018-02-24 NOTE — Op Note (Addendum)
[facility] Patient Name: Adam Hinton Procedure Date : 02/24/2018 MRN: [id number] Attending MD: Justice Britain , MD Date of Birth: 1956/04/25 CSN: [id number] Age: 62 Admit Type: Inpatient Procedure:                Colonoscopy Indications:              Evaluation on imaging study of clinically                            significant abnormality, This is the patient's                            first colonoscopy Providers:                Justice Britain, MD, Baird Cancer, RN, Elspeth Cho Tech., Technician Referring MD:              Medicines:                Monitored Anesthesia Care Complications:            No immediate complications. Estimated Blood Loss:     Estimated blood loss was minimal. Procedure:                Pre-Anesthesia Assessment:                           - Prior to the procedure, a History and Physical                            was performed, and patient medications and                            allergies were reviewed. The patient's tolerance of                            previous anesthesia was also reviewed. The risks                            and benefits of the procedure and the sedation                            options and risks were discussed with the patient.                            All questions were answered, and informed consent                            was obtained. Prior Anticoagulants: The patient has                            taken no previous anticoagulant or antiplatelet                            agents. ASA Grade Assessment:  II - A patient with                            mild systemic disease. After reviewing the risks                            and benefits, the patient was deemed in                            satisfactory condition to undergo the procedure.                           After obtaining informed consent, the colonoscope                            was passed under direct  vision. Throughout the                            procedure, the patient's blood pressure, pulse, and                            oxygen saturations were monitored continuously. The                            PCF-[id number] ([id number]) peds colon was introduced                            through the anus and advanced to the 10 cm into the                            ileum. The colonoscopy was performed without                            difficulty. The patient tolerated the procedure.                            The quality of the bowel preparation was evaluated                            using the BBPS [facility] Bowel Preparation Scale)                            with scores of: Right Colon = 3, Transverse Colon =                            3 and Left Colon = 3 (entire mucosa seen well with                            no residual staining, small fragments of stool or                            opaque liquid). The total BBPS score equals 9. Scope In: 8:49:16 AM Scope Out: 3:15:17 AM Scope  Withdrawal Time: 0 hours 36 minutes 22 seconds  Total Procedure Duration: 0 hours 40 minutes 2 seconds  Findings:      The digital rectal exam findings include non-thrombosed external       hemorrhoids and non-thrombosed internal hemorrhoids.      The terminal ileum and ileocecal valve appeared normal.      One medium nodule consistent with a partially inverted appendiceal       orifice was found.      A 15 mm polyp was found in the transverse colon. The polyp was sessile.       Preparations were made for mucosal resection. Saline was injected to       raise the lesion. Snare mucosal resection was performed. Resection and       retrieval were complete. Coagulation for tissue destruction using snare       tip was successful.      Three sessile polyps were found in the rectum (2) and cecum (1). The       polyps were 2 to 4 mm in size. These polyps were removed with a cold       snare. Resection and retrieval were  complete.      A 18 mm polyp was found in the descending colon. The polyp was sessile.       The polyp was removed with a hot snare. Resection and retrieval were       complete. Coagulation for tissue destruction using snare tip was       successful. To close the mucosal defect after polypectomy, three       hemostatic clips were successfully placed (MR conditional). There was no       bleeding during, or at the end, of the procedure.      A 18 mm polyp was found in the rectum. The polyp was pedunculated.       Preparations were made for mucosal resection. A 1:10,000 solution of       epinephrine and saline was injected to raise the lesion within the       stalk. Snare mucosal resection was performed. Resection and retrieval       were complete.      Many small and large-mouthed diverticula were found in the recto-sigmoid       colon, sigmoid colon and ascending colon.      Non-bleeding non-thrombosed external and internal hemorrhoids were found       during retroflexion, during perianal exam and during digital exam. The       hemorrhoids were Grade III (internal hemorrhoids that prolapse but       require manual reduction). Impression:               - Non-thrombosed external hemorrhoids and                            non-thrombosed internal hemorrhoids found on                            digital rectal exam.                           - The examined portion of the ileum was normal.                           -  Partially inverted appendiceal orifice was found.                           - Three 2 to 4 mm polyps in the rectum and in the                            cecum, removed with a cold snare. Resected and                            retrieved.                           - One 15 mm polyp in the transverse colon, removed                            with mucosal resection. Resected and retrieved.                            Treated with a hot snare tip to decrease risk of                             recurrence.                           - One 18 mm polyp in the descending colon, removed                            with a hot snare. Resected and retrieved. Treated                            with hot snare tip to decrease risk of recurrence.                            Clips (MR conditional) were placed to close defect.                           - One 18 mm polyp in the rectum, removed with                            mucosal resection. Resected and retrieved.                           - Diverticulosis of the Ascending Colon and the                            Sigmoid Colon and the Rectosigmoid Colon                            (predominance in the Left Colon).                           - Non-bleeding non-thrombosed external and internal  hemorrhoids. Recommendation:           - The patient will be observed post-procedure,                            until all discharge criteria are met.                           - Return patient to hospital ward for ongoing care.                           - Patient has a contact number available for                            emergencies. The signs and symptoms of potential                            delayed complications were discussed with the                            patient. Return to normal activities tomorrow.                            Written discharge instructions were provided to the                            patient.                           - Resume regular diet.                           - Continue present medications.                           - Await pathology results.                           - Repeat colonoscopy in 3 years for surveillance                            based on pathology results.                           - Follow up with Surgery to discuss resection of                            the appendiceal lesion noted on CT scan. Would also                            consider the role of an EUA to  evaluate the rectal                            hemorrhoids and see if any other therapies could be  considered.                           - Colace 1-2 times daily, Fiber supplementation 1-2                            times daily, Miralax every other day to maintain                            stool consistency is optimal or more frequently if                            necessary.                           - The findings and recommendations were discussed                            with the patient.                           - The findings and recommendations were discussed                            with the referring physician. Procedure Code(s):        --- Professional ---                           [phone], 59, Colonoscopy, flexible; with endoscopic                            mucosal resection                           [phone], Colonoscopy, flexible; with removal of                            tumor(s), polyp(s), or other lesion(s) by snare                            technique Diagnosis Code(s):        --- Professional ---                           K64.2, Third degree hemorrhoids                           K64.4, Residual hemorrhoidal skin tags                           K63.89, Other specified diseases of intestine                           K62.1, Rectal polyp                           D12.0, Benign neoplasm of cecum  D12.3, Benign neoplasm of transverse colon (hepatic                            flexure or splenic flexure)                           D12.4, Benign neoplasm of descending colon                           R93.3, Abnormal findings on diagnostic imaging of                            other parts of digestive tract CPT copyright 2018 [employer]. All rights reserved. The codes documented in this report are preliminary and upon coder review may  be revised to meet current compliance requirements. Justice Britain,  MD 02/24/2018 9:52:23 AM Number of Addenda: 0

## 2018-02-24 NOTE — Anesthesia Preprocedure Evaluation (Signed)
Anesthesia Evaluation  Patient identified by MRN, date of birth, ID band Patient awake    Reviewed: Allergy & Precautions, NPO status , Patient's Chart, lab work & pertinent test results  Airway Mallampati: II  TM Distance: >3 FB Neck ROM: Full    Dental no notable dental hx.    Pulmonary Current Smoker,    Pulmonary exam normal breath sounds clear to auscultation       Cardiovascular negative cardio ROS Normal cardiovascular exam Rhythm:Regular Rate:Normal     Neuro/Psych negative neurological ROS  negative psych ROS   GI/Hepatic negative GI ROS, Neg liver ROS,   Endo/Other  negative endocrine ROS  Renal/GU negative Renal ROS     Musculoskeletal negative musculoskeletal ROS (+)   Abdominal   Peds  Hematology negative hematology ROS (+)   Anesthesia Other Findings abnormal CT scan appendiceal mass  Reproductive/Obstetrics                             Anesthesia Physical Anesthesia Plan  ASA: II  Anesthesia Plan: MAC   Post-op Pain Management:    Induction: Intravenous  PONV Risk Score and Plan: 1 and Propofol infusion and Treatment may vary due to age or medical condition  Airway Management Planned: Natural Airway  Additional Equipment:   Intra-op Plan:   Post-operative Plan:   Informed Consent: I have reviewed the patients History and Physical, chart, labs and discussed the procedure including the risks, benefits and alternatives for the proposed anesthesia with the patient or authorized representative who has indicated his/her understanding and acceptance.   Dental advisory given  Plan Discussed with: CRNA  Anesthesia Plan Comments:         Anesthesia Quick Evaluation

## 2018-02-25 LAB — BASIC METABOLIC PANEL
Anion gap: 5 (ref 5–15)
BUN: 7 mg/dL — AB (ref 8–23)
CHLORIDE: 106 mmol/L (ref 98–111)
CO2: 27 mmol/L (ref 22–32)
CREATININE: 0.79 mg/dL (ref 0.61–1.24)
Calcium: 8.9 mg/dL (ref 8.9–10.3)
GFR calc Af Amer: >60 mL/min (ref >60)
GFR calc non Af Amer: >60 mL/min (ref >60)
Glucose, Bld: 110 mg/dL — ABNORMAL HIGH (ref 70–99)
Potassium: 3.7 mmol/L (ref 3.5–5.1)
SODIUM: 138 mmol/L (ref 135–145)

## 2018-02-25 LAB — CBC WITH DIFFERENTIAL/PLATELET
Abs Immature Granulocytes: 0.04 10*3/uL (ref 0.00–0.07)
BASOS PCT: 1 %
Basophils Absolute: 0 10*3/uL (ref 0.0–0.1)
EOS PCT: 3 %
Eosinophils Absolute: 0.3 10*3/uL (ref 0.0–0.5)
HCT: 39.4 % (ref 39.0–52.0)
Hemoglobin: 13.2 g/dL (ref 13.0–17.0)
Immature Granulocytes: 1 %
Lymphocytes Relative: 19 %
Lymphs Abs: 1.5 10*3/uL (ref 0.7–4.0)
MCH: 30.7 pg (ref 26.0–34.0)
MCHC: 33.5 g/dL (ref 30.0–36.0)
MCV: 91.6 fL (ref 80.0–100.0)
MONO ABS: 0.9 10*3/uL (ref 0.1–1.0)
Monocytes Relative: 11 %
Neutro Abs: 5.1 10*3/uL (ref 1.7–7.7)
Neutrophils Relative %: 65 %
Platelets: 224 10*3/uL (ref 150–400)
RBC: 4.3 MIL/uL (ref 4.22–5.81)
RDW: 12.8 % (ref 11.5–15.5)
WBC: 7.8 10*3/uL (ref 4.0–10.5)
nRBC: 0 % (ref 0.0–0.2)

## 2018-02-25 MED ORDER — TAMSULOSIN HCL 0.4 MG PO CAPS: 0.4000 mg | ORAL_CAPSULE | Freq: Every day | ORAL | 1 refills | Status: AC

## 2018-02-25 MED ORDER — NICOTINE 14 MG/24HR TD PT24: 14.0000 mg | MEDICATED_PATCH | Freq: Every day | TRANSDERMAL | 0 refills | Status: AC

## 2018-02-25 MED ORDER — SENNOSIDES-DOCUSATE SODIUM 8.6-50 MG PO TABS: 2.0000 | ORAL_TABLET | Freq: Every day | ORAL | 1 refills | Status: AC

## 2018-02-25 MED ORDER — CIPROFLOXACIN HCL 500 MG PO TABS: 500.0000 mg | ORAL_TABLET | Freq: Two times a day (BID) | ORAL | 0 refills | Status: AC

## 2018-02-25 MED FILL — NICOTINE 14 MG/24HR PATCH: 14 | 28 days supply | Qty: 28 | Fill #0

## 2018-02-25 NOTE — Anesthesia Postprocedure Evaluation (Signed)
Anesthesia Post Note  Patient: Adam Hinton  Procedure(s) Performed: COLONOSCOPY WITH PROPOFOL (N/A ) POLYPECTOMY HEMOSTASIS CONTROL SCLEROTHERAPY     Patient location during evaluation: PACU Anesthesia Type: MAC Level of consciousness: awake and alert Pain management: pain level controlled Vital Signs Assessment: post-procedure vital signs reviewed and stable Respiratory status: spontaneous breathing, nonlabored ventilation, respiratory function stable and patient connected to nasal cannula oxygen Cardiovascular status: stable and blood pressure returned to baseline Postop Assessment: no apparent nausea or vomiting Anesthetic complications: no    Last Vitals:  Vitals:   02/24/18 2207 02/25/18 0410  BP: 121/77 116/84  Pulse: (!) 57 (!) 57  Resp: 18 18  Temp: 36.7 C 36.7 C  SpO2: 99% 96%    Last Pain:  Vitals:   02/25/18 0410  TempSrc: Oral  PainSc:                  Karyl Kinnier Ellender

## 2018-02-25 NOTE — Discharge Instructions (Signed)
1)Follow up with the General Surgeon Dr. Barry Dienes to discuss interval appendectomy in the office on 03/23/18 2) continue Flomax and follow-up with urologist at [facility] urology in about a week to discuss possible removal of your Foley catheter 3)Routine Foley catheter care as advised 4) follow-up with gastroenterology for repeat colonoscopy in about 3 years  1) follow-up with urologist at [facility] urology within a week for possible Foley catheter removal and follow-up with general surgeon Dr. Barry Dienes to discuss interval appendectomy in the office on 03/23/18

## 2018-02-25 NOTE — Discharge Summary (Signed)
Diamonte Stavely, is a 62 y.o. male  DOB [birth year]  MRN [id number].  Admission date:  02/21/2018  Admitting Physician  Patrecia Pour, MD  Discharge Date:  02/25/2018   Primary MD  Patient, No Pcp Per  Recommendations for primary care physician for things to follow:   1)Follow up with the General Surgeon Dr. Barry Dienes to discuss interval appendectomy in the office on 03/23/18 2) continue Flomax and follow-up with urologist at [facility] urology in about a week to discuss possible removal of your Foley catheter 3)Routine Foley catheter care as advised 4) follow-up with gastroenterology for repeat colonoscopy in about 3 years  1) follow-up with urologist at [facility] urology within a week for possible Foley catheter removal and follow-up with general surgeon Dr. Barry Dienes to discuss interval appendectomy in the office on 03/23/18   Admission Diagnosis  Elevated blood pressure reading without diagnosis of hypertension [R03.0] Suprapubic pain [R10.2] Urinary obstruction [N13.9]   Discharge Diagnosis  Elevated blood pressure reading without diagnosis of hypertension [R03.0] Suprapubic pain [R10.2] Urinary obstruction [N13.9]    Active Problems:   Prostatitis   Bladder outlet obstruction   Mucocele of appendix   Constipation   Tobacco use   Hypokalemia   Suprapubic pain   Urinary obstruction   Abnormal CT scan, colon   Benign neoplasm of appendix      History reviewed. No pertinent past medical history.  History reviewed. No pertinent surgical history.     HPI  from the history and physical done on the day of admission:    Chief Complaint: Urinary retention, pelvic pressure/pain, constipation  HPI: Purl Claytor is a 62 y.o. male with no medical follow up or known PMH who presented to the ED on the advise of urgent care MD due to urinary retention among other symptoms. He reports noticing gradual  onset of pressure-type discomfort in the suprapubic area and superior to the base of the penis 2 nights ago which has remained constant, waxing/waning and is associated with severe urinary hesitancy and small volume urinary incontinence and constipation. Constipation was not improved with suppository and caused him to stop eating, though he kept drinking fluids.    ED Course: He appeared in no distress, hypertensive, metabolic panel with K 3.1, creatinine 1.15, and WBC 19k. He was unable to urinate so I/O catheterization was performed for urinalysis, yielding 1300cc urine which had no WBCs or bacteria, 6-10 RBCs. CT abdomen/pelvis performed showed a circumscribed low density structure in the expected location of the appendix that was 2.4x3.9x3.0cm, diverticulosis without diverticulitis, and R > L perinephric stranding without hydronephrosis, ureteral and bladder wall enhancement and enlarged (5.9cm) heterogeneously enhancing prostate. Mild GGOs in RML and lingula incidentally noted as well. Concern was for appendicitis vs. mucocele. Surgery consulted, hospitalists asked to admit. Cipro/flagyl started. GI and urology consulted for additional recommendations and indwelling foley catheterization ordered with bladder scan showing 999cc.   - ROS: No fevers, chills, abdominal pain. +nausea and vomiting. Some red blood per rectum but not significant. No other  bleeding/bruising. Denies chest pain, dyspnea, cough, and per HPI. All others reviewed and are negative.   - SH: Smokes < 1/2ppd cigarettes and drinks irregularly. No illicit drugs. Has a single male partner but does not live with her.   - FH: No history of prostate cancer, colon cancer  - PMSH: None, has been told he may have HTN.   Noel Rodier a 62 y.o.malewithno medical follow up or known PMH who presented to the ED on the advise of urgent care MD due to urinary retention and suprapubic pain. He reports noticing gradual onset of  pressure-type suprapubic pain about 2 nights ago, which remained constant, waxing/waning and is associated with severe urinary hesitancy and small volume urinary incontinence and constipation, no dysuria. In the ED, pt was unable to urinate so I/O catheterization was performed for urinalysis, yielding 1300cc urine which had no WBCs or bacteria, 6-10 RBCs. A foley catheter was eventually inserted after bladder scan showed persistent retention. CT abdomen/pelvis performed showed a circumscribed low density structure in the expected location of the appendix that was 2.4x3.9x3.0cm, diverticulosis without diverticulitis, and R >L perinephric stranding without hydronephrosis, ureteral and bladder wall enhancement and enlarged (5.9cm) heterogeneously enhancing prostate. Concern was for appendicitis vs. mucocele. Surgery, GI and urology consulted. Cipro/flagyl started. Pt admitted for further management.    Hospital Course:    Chaynce Schafer a 62 y.o.malewithno medical follow up or known PMH who presented to the ED on the advise of urgent care MD due to urinary retention and suprapubic pain. He reports noticing gradual onset of pressure-type suprapubic pain about 2 nights ago, which remained constant, waxing/waning and is associated with severe urinary hesitancy and small volume urinary incontinence and constipation, no dysuria. In the ED, pt was unable to urinate so I/O catheterization was performed for urinalysis, yielding 1300cc urine which had no WBCs or bacteria, 6-10 RBCs. A foley catheter was eventually inserted after bladder scan showed persistent retention. CT abdomen/pelvis performed showed a circumscribed low density structure in the expected location of the appendix that was 2.4x3.9x3.0cm, diverticulosis without diverticulitis, and R >L perinephric stranding without hydronephrosis, ureteral and bladder wall enhancement and enlarged (5.9cm) heterogeneously enhancing prostate. Concern was for  appendicitis vs. mucocele. Surgery, GI and urology consulted. Cipro/flagyl started. Pt admitted for further management  Bladder outlet obstruction, ?prostatitis---- follow-up with urologist at [facility] urology within a week for possible Foley catheter removal, Afebrile, resolved leukocytosis, UA unremarkable, UC no growth, CT abdomen/pelvis performed showed bilateral perinephric stranding without hydronephrosis, ureteral and bladder wall enhancement and enlarged (5.9cm) heterogeneously enhancing prostate, Cipro as ordered possible prostatitis, continue Flomax.  urology consult appreciated   Appendiceal mucocele-- follow-up with general surgeon Dr. Barry Dienes to discuss interval appendectomy in the office on 03/23/18,  GI and general surgery consult appreciated, colonoscopy on 02/24/2018 with polypectomy and partially inverted appendiceal orifice as well as external and internal hemorrhoids noted, okay to discharge on Cipro   Constipation Continue senna, miralax, prn ducolax   Tobacco abuse Advised to quit  Discharge Condition: stable  Follow UP  Follow-up Information    Stark Klein, MD Follow up on 03/23/2018.   Specialty:  General Surgery Why:  10:45am, arrive by 10:15am for paperwork and check in Contact information: [place] [address] [phone]          Diet and Activity recommendation:  As advised  Discharge Instructions    Discharge Instructions    Call MD for:  difficulty breathing, headache or visual disturbances  Complete by:  As directed    Call MD for:  persistant dizziness or light-headedness   Complete by:  As directed    Call MD for:  persistant nausea and vomiting   Complete by:  As directed    Call MD for:  temperature >100.4   Complete by:  As directed    Diet - low sodium heart healthy   Complete by:  As directed    Discharge instructions   Complete by:  As directed    1)Follow up with the General Surgeon Dr. Barry Dienes  to discuss interval appendectomy in the office on 03/23/18 2) continue Flomax and follow-up with urologist at [facility] urology in about a week to discuss possible removal of your Foley catheter 3)Routine Foley catheter care as advised 4) follow-up with gastroenterology for repeat colonoscopy in about 3 years   Increase activity slowly   Complete by:  As directed         Discharge Medications     Allergies as of 02/25/2018   No Known Allergies     Medication List    STOP taking these medications   ibuprofen 200 MG tablet Commonly known as:  ADVIL,MOTRIN   STOOL SOFTENER PO     TAKE these medications   ciprofloxacin 500 MG tablet Commonly known as:  CIPRO Take 1 tablet (500 mg total) by mouth 2 (two) times daily for 4 days.   nicotine 14 mg/24hr patch Commonly known as:  NICODERM CQ - dosed in mg/24 hours Place 1 patch (14 mg total) onto the skin daily. Start taking on:  02/26/2018   senna-docusate 8.6-50 MG tablet Commonly known as:  Senokot-S Take 2 tablets by mouth at bedtime.   tamsulosin 0.4 MG Caps capsule Commonly known as:  FLOMAX Take 1 capsule (0.4 mg total) by mouth daily after supper.       Major procedures and Radiology Reports - PLEASE review detailed and final reports for all details, in brief -   Ct Abdomen Pelvis W Contrast  Result Date: 02/21/2018 CLINICAL DATA:  Constipation and urinary retention for last 2 days. EXAM: CT ABDOMEN AND PELVIS WITH CONTRAST TECHNIQUE: Multidetector CT imaging of the abdomen and pelvis was performed using the standard protocol following bolus administration of intravenous contrast. CONTRAST:  112mL OMNIPAQUE IOHEXOL 300 MG/ML  SOLN COMPARISON:  None. FINDINGS: Lower chest: Mild ground-glass attenuation of the right middle lobe and lingula, likely infectious/inflammatory. Hepatobiliary: No focal liver abnormality is seen. No gallstones, gallbladder wall thickening, or biliary dilatation. Pancreas: Unremarkable. No  pancreatic ductal dilatation or surrounding inflammatory changes. Spleen: Normal in size without focal abnormality. Adrenals/Urinary Tract: Normal adrenal glands. Bilateral perinephric stranding, more prominent on the right. Bilateral benign-appearing renal cysts, the larger of of the superior pole of the left kidney measures 3 cm. No nephrolithiasis or hydronephrosis. Mild mucosal thickening and hyperenhancement of the urinary bladder, and possibly the ureters. Stomach/Bowel: Normal appearance of the stomach and small bowel. Scattered colonic diverticulosis without evidence of acute diverticulitis. Low-density circumscribed structure in the right pelvis measures 2.4 x 3.9 x 3.0 cm. It comes in contact with the cecum, inferior to the ileocecal valve, at the expected location of the appendix. Vascular/Lymphatic: Aortic atherosclerosis, mild. No enlarged abdominal or pelvic lymph nodes. Reproductive: Enlargement and heterogeneous enhancement of the prostate gland, which measures 5.9 cm Other: No abdominal wall hernia or abnormality. No abdominopelvic ascites. Musculoskeletal: No acute or significant osseous findings. IMPRESSION: Bilateral perinephric stranding, hyperenhancement and mucosal thickening of the urinary bladder  and the bilateral ureters. Enlargement and heterogeneous enhancement of the prostate gland. These findings may reflect complicated urinary tract infection/prostatitis, or bladder outlet obstruction caused by the enlarged prostate gland. Please correlate to PSA values and urinalysis results. Low-density circumscribed structure adjacent to the distal cecum may represent a cystically dilated appendix with thickened walls. This may be secondary to an acute appendicitis or an appendiceal mucocele. Surgical consultation is recommended. Electronically Signed   By: Fidela Salisbury M.D.   On: 02/21/2018 15:59    Micro Results    Recent Results (from the past 240 hour(s))  Urine culture     Status:  None   Collection Time: 02/21/18  2:50 PM  Result Value Ref Range Status   Specimen Description URINE, RANDOM  Final   Special Requests NONE  Final   Culture   Final    NO GROWTH Performed at [facility], [address], [place], [place] [address]    Report Status 02/22/2018 FINAL  Final       Today   Subjective    Miki Blank today has no new concerns, abdominal pain is better, no dysuria, no fevers eating and drinking okay          Patient has been seen and examined prior to discharge   Objective   Blood pressure 116/84, pulse (!) 57, temperature 98.1 F (36.7 C), temperature source Oral, resp. rate 18, height 5\' 9"  (1.753 m), weight 68 kg, SpO2 96 %.   Intake/Output Summary (Last 24 hours) at 02/25/2018 1414 Last data filed at 02/25/2018 0418 Gross per 24 hour  Intake 300 ml  Output 1600 ml  Net -1300 ml    Exam Gen:- Awake Alert, very very talkative HEENT:- .AT, No sclera icterus Neck-Supple Neck,No JVD,.  Lungs-  CTAB , good air movement CV- S1, S2 normal, regular Abd-  +ve B.Sounds, Abd Soft, No tenderness,    Extremity/Skin:- No  edema,   good pulses Psych-affect is appropriate, oriented x3 Neuro-no new focal deficits, no tremors GU--Foley with clear urine  Data Review   CBC w Diff:  Lab Results  Component Value Date   WBC 7.8 02/25/2018   HGB 13.2 02/25/2018   HCT 39.4 02/25/2018   PLT 224 02/25/2018   LYMPHOPCT 19 02/25/2018   MONOPCT 11 02/25/2018   EOSPCT 3 02/25/2018   BASOPCT 1 02/25/2018    CMP:  Lab Results  Component Value Date   NA 138 02/25/2018   K 3.7 02/25/2018   CL 106 02/25/2018   CO2 27 02/25/2018   BUN 7 (L) 02/25/2018   CREATININE 0.79 02/25/2018   PROT 7.5 02/21/2018   ALBUMIN 4.2 02/21/2018   BILITOT 1.2 02/21/2018   ALKPHOS 63 02/21/2018   AST 43 (H) 02/21/2018   ALT 16 02/21/2018  .   Total Discharge time is about 33 minutes  Roxan Hockey M.D on 02/25/2018 at 2:14  PM  Pager---[phone]  Go to [email or web address] - password [email or web address] for contact info  [facility] - Office  [phone]

## 2018-02-25 NOTE — Progress Notes (Signed)
Patient discharged to go home by [place], Therapist, sports. Transported by taxi cab with belongings. Patient wheel chaired out to car by Chartered certified accountant.

## 2018-02-25 NOTE — Plan of Care (Signed)
  Problem: Education: Goal: Knowledge of General Education information will improve Description Including pain rating scale, medication(s)/side effects and non-pharmacologic comfort measures Outcome: Progressing Note:  POC reviewed with pt.   

## 2018-02-25 NOTE — Social Work (Signed)
Pt without ride or assistance from family/friends to get home today at discharge. CSW provided bedside RN Rakita with a cab voucher for pt.  Confirmed address at [address] [place], [place], [address].  CSW signing off. Please consult if any additional needs arise.  [place], [place] Work [phone]

## 2018-02-26 ENCOUNTER — Encounter (HOSPITAL_COMMUNITY): Payer: Self-pay | Admitting: Gastroenterology

## 2018-02-26 ENCOUNTER — Encounter: Payer: Self-pay | Admitting: Gastroenterology

## 2018-03-23 ENCOUNTER — Other Ambulatory Visit: Payer: Self-pay | Admitting: General Surgery

## 2019-11-30 IMAGING — CT CT ABD-PELV W/ CM
2 of 5 series · 15 of 46 positions shown, 17 images · IV contrast (APPLIED)
Comparison: None.

CLINICAL DATA: Constipation and urinary retention for last 2 days.

EXAM:
CT ABDOMEN AND PELVIS WITH CONTRAST
TECHNIQUE: Multidetector CT imaging of the abdomen and pelvis was performed
using the standard protocol following bolus administration of
intravenous contrast.
CONTRAST:  100mL OMNIPAQUE IOHEXOL 300 MG/ML  SOLN

[Series 3: abdomen 5.0 · axial · 0.69mm/px · z∈[+874,+1260]mm · 12 of 89 slices shown, 14 images]
[im 6/89  soft-tissue]
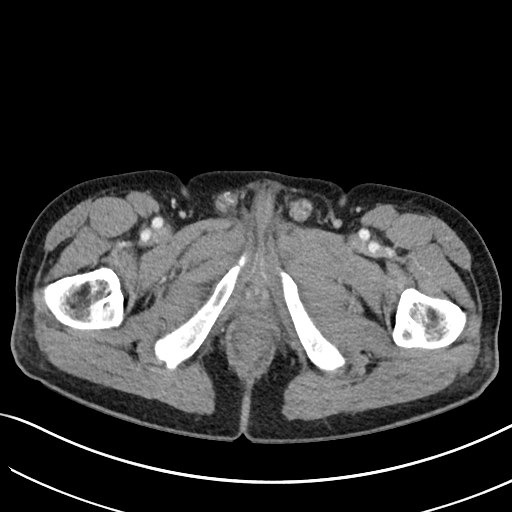
[im 6/89  bone]
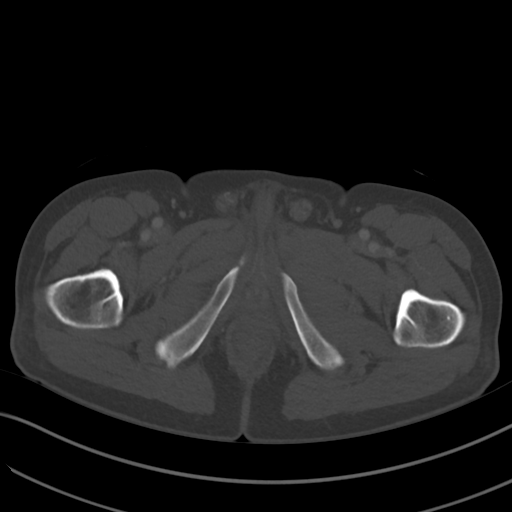
[im 12/89  soft-tissue]
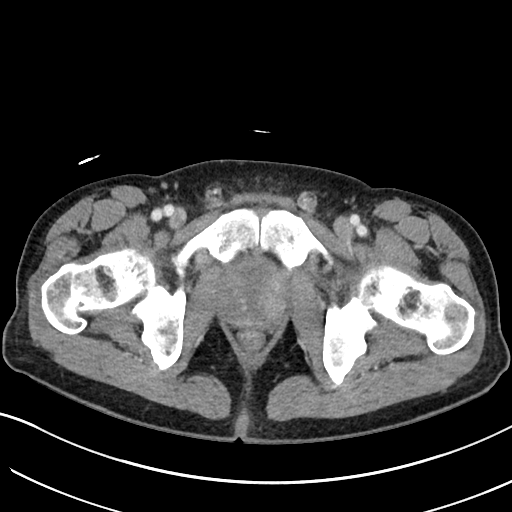
[im 18/89  soft-tissue]
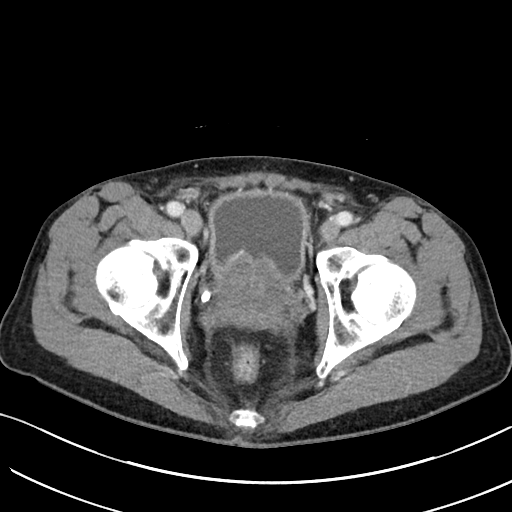
[im 30/89  soft-tissue]
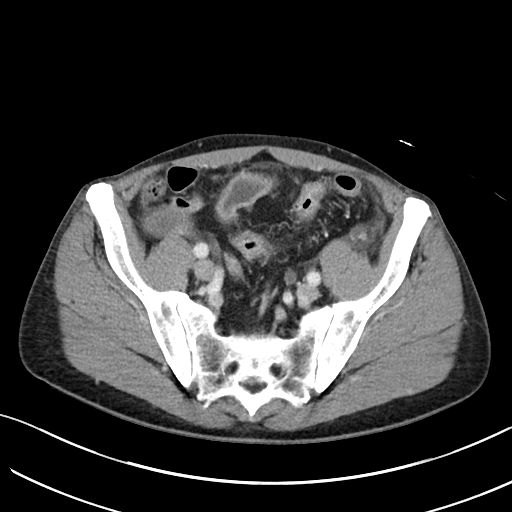
[im 36/89  soft-tissue]
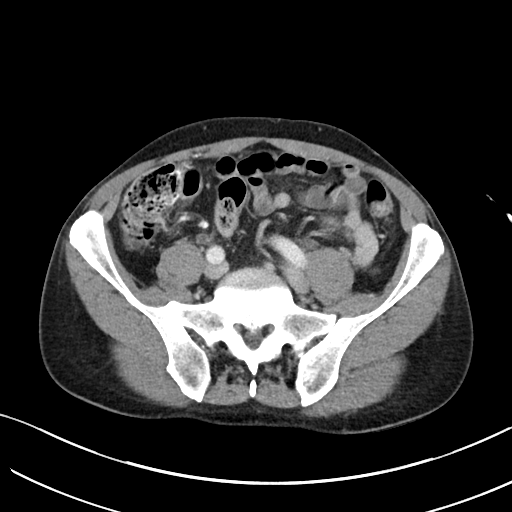
[im 42/89  soft-tissue]
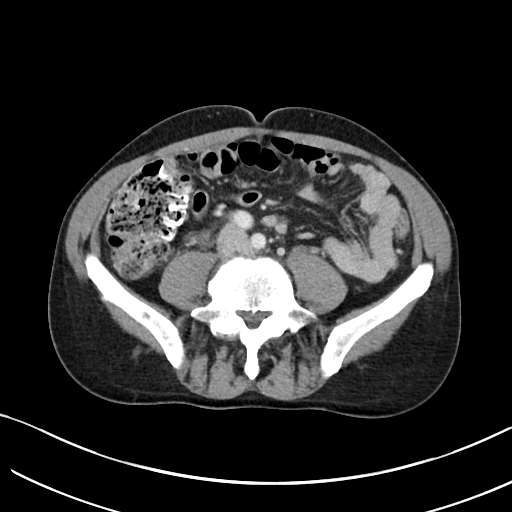
[im 47/89  soft-tissue]
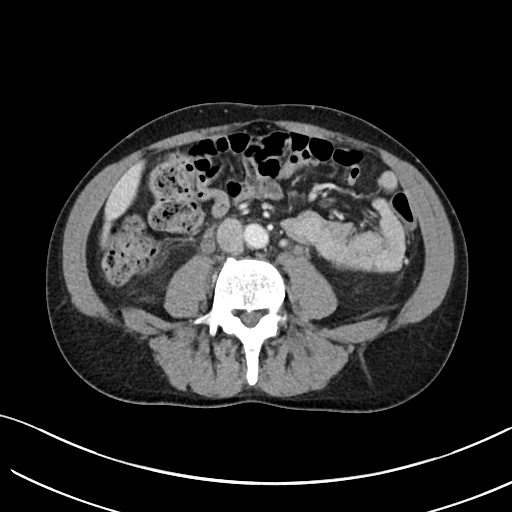
[im 53/89  soft-tissue]
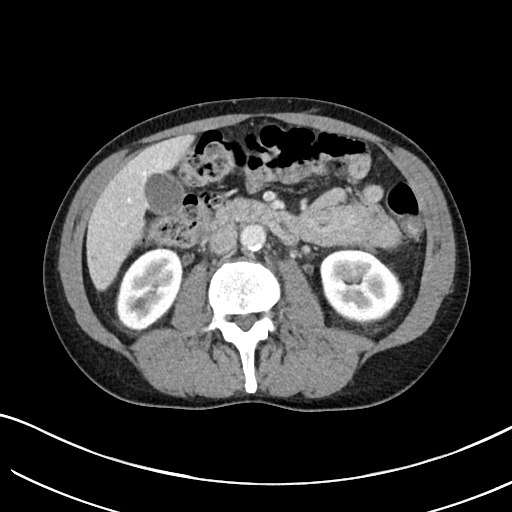
[im 59/89  soft-tissue]
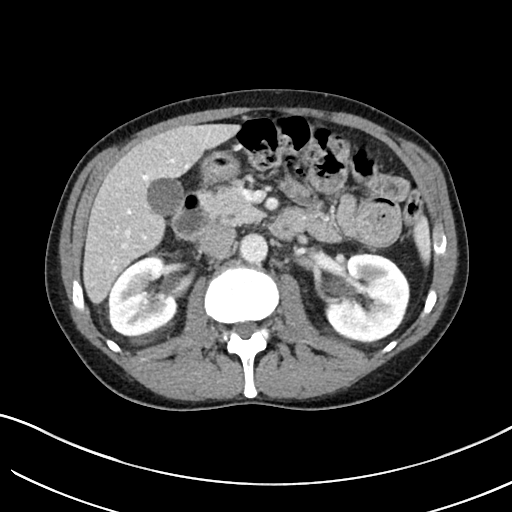
[im 59/89  bone]
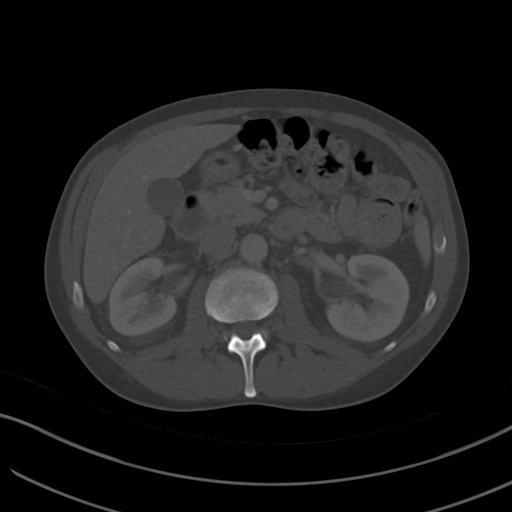
[im 71/89  soft-tissue]
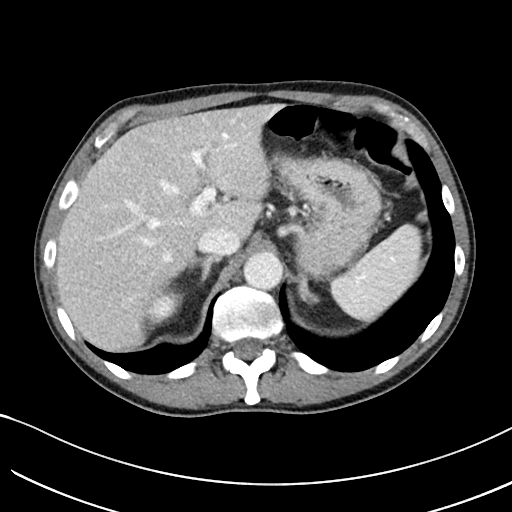
[im 77/89  soft-tissue]
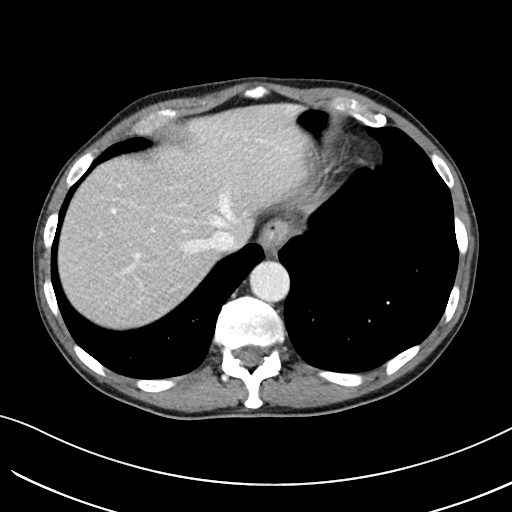
[im 83/89  soft-tissue]
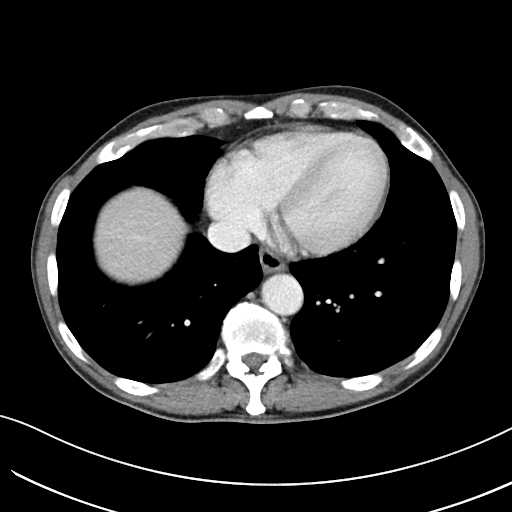

[Series 6: abdomen 3.0 mpr cor · coronal · 0.68mm/px · 3 of 79 slices shown]
[im 27/79  soft-tissue]
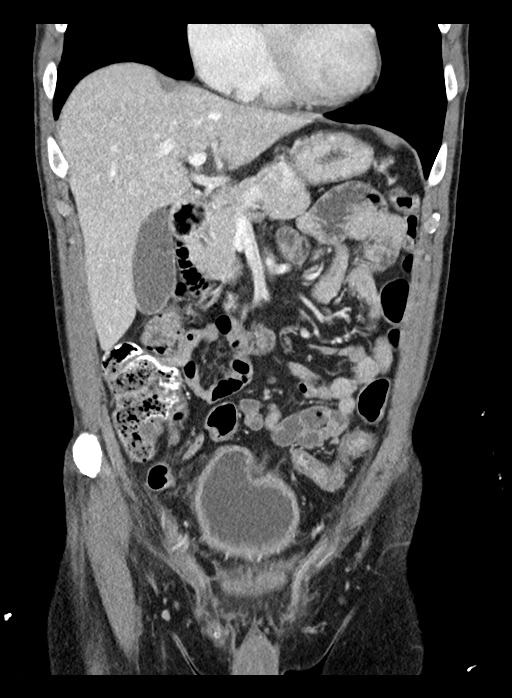
[im 35/79  soft-tissue]
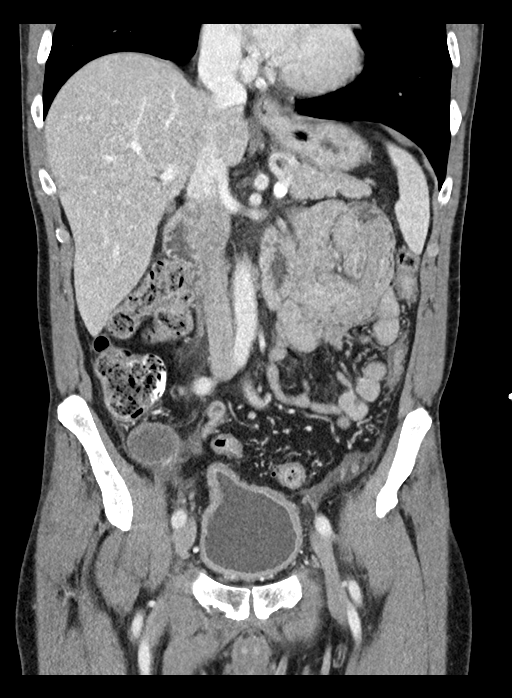
[im 44/79  soft-tissue]
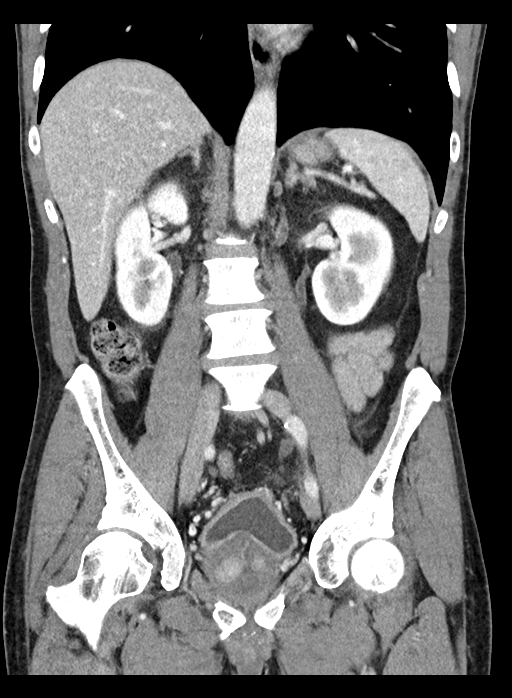

[15 of 46 positions shown; findings below may reference images not displayed]

FINDINGS: Lower chest: Mild ground-glass attenuation of the right middle lobe
and lingula, likely infectious/inflammatory.

Hepatobiliary: No focal liver abnormality is seen. No gallstones,
gallbladder wall thickening, or biliary dilatation.

Pancreas: Unremarkable. No pancreatic ductal dilatation or
surrounding inflammatory changes.

Spleen: Normal in size without focal abnormality.

Adrenals/Urinary Tract: Normal adrenal glands. Bilateral perinephric
stranding, more prominent on the right. Bilateral benign-appearing
renal cysts, the larger of of the superior pole of the left kidney
measures 3 cm. No nephrolithiasis or hydronephrosis. Mild mucosal
thickening and hyperenhancement of the urinary bladder, and possibly
the ureters.

Stomach/Bowel: Normal appearance of the stomach and small bowel.
Scattered colonic diverticulosis without evidence of acute
diverticulitis. Low-density circumscribed structure in the right
pelvis measures 2.4 x 3.9 x 3.0 cm. It comes in contact with the
cecum, inferior to the ileocecal valve, at the expected location of
the appendix.

Vascular/Lymphatic: Aortic atherosclerosis, mild. No enlarged
abdominal or pelvic lymph nodes.

Reproductive: Enlargement and heterogeneous enhancement of the
prostate gland, which measures 5.9 cm

Other: No abdominal wall hernia or abnormality. No abdominopelvic
ascites.

Musculoskeletal: No acute or significant osseous findings.
IMPRESSION: Bilateral perinephric stranding, hyperenhancement and mucosal
thickening of the urinary bladder and the bilateral ureters.

Enlargement and heterogeneous enhancement of the prostate gland.

These findings may reflect complicated urinary tract
infection/prostatitis, or bladder outlet obstruction caused by the
enlarged prostate gland. Please correlate to PSA values and
urinalysis results.

Low-density circumscribed structure adjacent to the distal cecum may
represent a cystically dilated appendix with thickened walls. This
may be secondary to an acute appendicitis or an appendiceal
mucocele. Surgical consultation is recommended.

## 2022-06-18 ENCOUNTER — Ambulatory Visit: Payer: BLUE CROSS/BLUE SHIELD | Admitting: Family Medicine
